# Patient Record
Sex: Male | Born: 1960 | Race: Black or African American | Hispanic: No | Smoking: Never smoker
Health system: Southern US, Community
[De-identification: ages and names within clinical notes are randomized; demographics above are authoritative.]

## PROBLEM LIST (undated history)

## (undated) DIAGNOSIS — J45909 Unspecified asthma, uncomplicated: Secondary | ICD-10-CM

## (undated) DIAGNOSIS — Z8679 Personal history of other diseases of the circulatory system: Secondary | ICD-10-CM

## (undated) DIAGNOSIS — IMO0001 Reserved for inherently not codable concepts without codable children: Secondary | ICD-10-CM

## (undated) DIAGNOSIS — K219 Gastro-esophageal reflux disease without esophagitis: Secondary | ICD-10-CM

## (undated) DIAGNOSIS — J939 Pneumothorax, unspecified: Secondary | ICD-10-CM

## (undated) HISTORY — PX: KNEE SURGERY: SHX244

## (undated) HISTORY — PX: BACK SURGERY: SHX140

## (undated) HISTORY — PX: NASAL SINUS SURGERY: SHX719

---

## 2000-03-20 ENCOUNTER — Emergency Department (HOSPITAL_COMMUNITY): Admission: EM | Admit: 2000-03-20 | Discharge: 2000-03-21 | Payer: Self-pay | Admitting: Emergency Medicine

## 2014-03-16 ENCOUNTER — Emergency Department
Admission: EM | Admit: 2014-03-16 | Discharge: 2014-03-16 | Disposition: A | Discharge status: Home / Self Care | Payer: PRIVATE HEALTH INSURANCE | Source: Home / Self Care | Attending: Emergency Medicine | Admitting: Emergency Medicine

## 2014-03-16 ENCOUNTER — Encounter: Payer: Self-pay | Admitting: Emergency Medicine

## 2014-03-16 DIAGNOSIS — R079 Chest pain, unspecified: Secondary | ICD-10-CM

## 2014-03-16 HISTORY — DX: Gastro-esophageal reflux disease without esophagitis: K21.9

## 2014-03-16 HISTORY — DX: Personal history of other diseases of the circulatory system: Z86.79

## 2014-03-16 HISTORY — DX: Reserved for inherently not codable concepts without codable children: IMO0001

## 2014-03-16 MED ORDER — GI COCKTAIL ~~LOC~~
30.0000 mL | Freq: Once | ORAL | Status: AC
  Administered 2014-03-16: 30 mL via ORAL

## 2014-03-16 NOTE — ED Notes (Signed)
Jonathan Barajas c/o CP that started last night in center of chest. After taking Maalox and repositioning the pain resolved. Pain has been central chest and intermittent today. C/o "just  Not feeling well today". 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 Len[facility] 1 month ago, no changes in aneurysm. Denies SOB, diaphoresis, jaw or back pain. Dr. Orson AloeHenderson notifed of patient hx and condition. EKG done.

## 2014-03-16 NOTE — ED Provider Notes (Signed)
CSN: [id number]     Arrival date & time 03/16/14  1616 History   First MD Initiated Contact with Patient 03/16/14 1642     Chief Complaint  Patient presents with  . Chest Pain   (Consider location/radiation/quality/duration/timing/severity/associated sxs/prior Treatment) HPI 53 year old American male presents chest pain since last night.  It is central and did not radiate, no shortness of breath, no diaphoresis, no feeling of impending doom.  He does have a history of gastric reflux and takes some Maalox last night which did help.  However he also sees a cardiologist yearly for an aneurysm (possibly a AAA) that he has yearly MRIs for and he was told about a month ago that is unchanged from last year.  His father still alive.  He is nonsmoker.  He does not have any history of hypertension or cholesterol issues and takes no medicines.  Painless that lasted for about 15 minutes and then went away.  He's had some minimal discomfort today but nothing severe.  He currently has no symptoms whatsoever.  His wife encouraged him to be checked out.  Past Medical History  Diagnosis Date  . Reflux   . History of cardiac aneurysm    Past Surgical History  Procedure Laterality Date  . Nasal sinus surgery    . Knee surgery     History reviewed. No pertinent family history. History  Substance Use Topics  . Smoking status: Never Smoker   . Smokeless tobacco: Never Used  . Alcohol Use: Yes    Review of Systems  All other systems reviewed and are negative.   Allergies  Review of patient's allergies indicates no known allergies.  Home Medications   Prior to Admission medications   Not on File   BP 130/82  Pulse 80  Resp 16  SpO2 97% Physical Exam  Nursing note and vitals reviewed. Constitutional: He is oriented to person, place, and time. Vital signs are normal. He appears well-developed and well-nourished.  Non-toxic appearance. He does not appear ill. No distress.  HENT:  Head:  Normocephalic and atraumatic.  Eyes: No scleral icterus.  Neck: Neck supple.  Cardiovascular: Normal rate, regular rhythm, normal heart sounds and intact distal pulses.   No extrasystoles are present.  Pulmonary/Chest: Effort normal and breath sounds normal. No respiratory distress. He has no decreased breath sounds. He has no wheezes. He has no rhonchi.  Abdominal: Normal appearance. There is no tenderness.  Neurological: He is alert and oriented to person, place, and time. GCS eye subscore is 4. GCS verbal subscore is 5. GCS motor subscore is 6.  Skin: Skin is warm and dry. He is not diaphoretic.  Psychiatric: He has a normal mood and affect. His speech is normal and behavior is normal. Thought content normal. His mood appears not anxious.    ED Course  Procedures (including critical care time) Labs Review Labs Reviewed - No data to display  Imaging Review No results found.   MDM   1. Chest pain    EKG done which shows nonspecific T-wave changes but no definite ST elevation or depression.  Patient initially given a GI cocktail however he has no pain now so would not make any difference.  I educated the patient on the options.  I feel the best for him to go to the emergency room for further evaluation and blood work.  I feel that he is healthy enough to drive himself to 2 miles to a local emergency room.  Patient declines this  and is going home instead.  I said if he does not go to the emergency room then I would like to at least call his cardiologist in the morning and gave him strict ER precautions for any returning of his symptoms.  However I let him know that  here in our urgent care I cannot do AMI  panels nor confirm the fact that this pain is from his heart or if it's coming from his reflux.  Therefore I let patient know that I wanted him to be checked out to ensure it's not his heart.  Patient again states that he does not want to go the emergency room and likely will call his  cardiologist tomorrow morning  No prescriptions given since he has reflux medicines at home.  His current physical examination is completely normal.    Marlaine HindJeffrey H Henderson, MD 03/16/14 1650

## 2014-03-18 ENCOUNTER — Telehealth: Payer: Self-pay | Admitting: *Deleted

## 2020-02-07 ENCOUNTER — Encounter: Payer: Self-pay | Admitting: Emergency Medicine

## 2020-02-07 ENCOUNTER — Emergency Department
Admission: EM | Admit: 2020-02-07 | Discharge: 2020-02-07 | Disposition: A | Discharge status: Home / Self Care | Payer: PRIVATE HEALTH INSURANCE | Source: Home / Self Care | Attending: Family Medicine | Admitting: Family Medicine

## 2020-02-07 ENCOUNTER — Other Ambulatory Visit: Payer: Self-pay

## 2020-02-07 ENCOUNTER — Telehealth: Payer: Self-pay | Admitting: Emergency Medicine

## 2020-02-07 ENCOUNTER — Ambulatory Visit (HOSPITAL_BASED_OUTPATIENT_CLINIC_OR_DEPARTMENT_OTHER)
Admission: RE | Admit: 2020-02-07 | Discharge: 2020-02-07 | Disposition: A | Discharge status: Home / Self Care | Payer: 59 | Source: Ambulatory Visit | Attending: Family Medicine | Admitting: Family Medicine

## 2020-02-07 DIAGNOSIS — M79662 Pain in left lower leg: Secondary | ICD-10-CM | POA: Insufficient documentation

## 2020-02-07 DIAGNOSIS — Z8616 Personal history of COVID-19: Secondary | ICD-10-CM

## 2020-02-07 LAB — COMPLETE METABOLIC PANEL WITH GFR
AG Ratio: 1.7 (calc) (ref 1.0–2.5)
ALT: 35 U/L (ref 9–46)
AST: 29 U/L (ref 10–35)
Albumin: 4.4 g/dL (ref 3.6–5.1)
Alkaline phosphatase (APISO): 68 U/L (ref 35–144)
BUN: 14 mg/dL (ref 7–25)
CO2: 24 mmol/L (ref 20–32)
Calcium: 9.5 mg/dL (ref 8.6–10.3)
Chloride: 108 mmol/L (ref 98–110)
Creat: 1.13 mg/dL (ref 0.70–1.33)
GFR, Est African American: 83 mL/min/{1.73_m2} (ref >=60)
GFR, Est Non African American: 71 mL/min/{1.73_m2} (ref >=60)
Globulin: 2.6 g/dL (calc) (ref 1.9–3.7)
Glucose, Bld: 101 mg/dL — ABNORMAL HIGH (ref 65–99)
Potassium: 4.3 mmol/L (ref 3.5–5.3)
Sodium: 140 mmol/L (ref 135–146)
Total Bilirubin: 0.8 mg/dL (ref 0.2–1.2)
Total Protein: 7 g/dL (ref 6.1–8.1)

## 2020-02-07 LAB — CK: Total CK: 268 U/L — ABNORMAL HIGH (ref 44–196)

## 2020-02-07 NOTE — Discharge Instructions (Addendum)
If symptoms become significantly worse during the night or over the weekend, proceed to the local emergency room.  

## 2020-02-07 NOTE — Telephone Encounter (Signed)
Writer spoke w/ pt - confirmed name & DOB. Results of DVT study on left leg were negative. Results called to pt per request by Dr Cathren Harsh. Labs pending, expect results on 02/08/20. Pt verbalized an understanding that he would only be notified if results were abnormal. My Chart set up instructions were given at discharge earlier today.

## 2020-02-07 NOTE — ED Triage Notes (Signed)
2nd Pfizer vaccine on 3/30, pt c/o of left leg calf cramps since then - only at night  No relief w/ tylenol Increased PO intake (gatorade) and cramps resolved on right leg, left is more of a painful pressure

## 2020-02-09 NOTE — ED Provider Notes (Signed)
[facility]    CSN: [id number] Arrival date & time: 02/07/20  1132      History   Chief Complaint Chief Complaint  Patient presents with   Leg Pain    left calf     HPI Jonathan Barajas is a 59 y.o. male.    Patient reports that he had second Pfizer COVID19 vaccination on 01/26/20.  Afterwards he developed bilateral leg cramps, primarily at night.  He increased his PO fluid intake (Gatorade) and right leg cramps resolved.  He describes his discomfort as a pressure-like ache, concentrated on his left lateral calf.  He denies swelling, warmth, or claudication.  He denies recent increase in physical activities.  He denies chest tightness or shortness of breath He denies history of DVT.  He was hospitalized for COVID19 in September 2020.     2nd Pfizer vaccine on 3/30, pt c/o of left leg calf cramps since then - only at night  No relief w/ tylenol Increased PO intake (gatorade) and cramps resolved on right leg, left is more of a painful pressure  The history is provided by the patient.  Leg Pain Location:  Leg Time since incident:  12 days Injury: no   Leg location:  L leg Pain details:    Quality:  Aching   Radiates to:  Does not radiate   Severity:  Mild   Onset quality:  Gradual   Duration:  11 days   Timing:  Constant   Progression:  Unchanged Chronicity:  New Prior injury to area:  No Exacerbated by: nighttime. Ineffective treatments:  None tried Associated symptoms: no decreased ROM, no fatigue, no fever, no muscle weakness, no swelling and no tingling     Past Medical History:  Diagnosis Date   History of cardiac aneurysm    Reflux     There are no problems to display for this patient.   Past Surgical History:  Procedure Laterality Date   KNEE SURGERY     NASAL SINUS SURGERY         Home Medications    Prior to Admission medications   Not on File    Family History Family History  Problem Relation Age of Onset   Diabetes  Father    Healthy Sister    Healthy Brother    Healthy Sister    Healthy Brother    Healthy Brother     Social History Social History   Tobacco Use   Smoking status: Never Smoker   Smokeless tobacco: Never Used  Substance Use Topics   Alcohol use: Yes    Alcohol/week: 3.0 standard drinks    Types: 3 Standard drinks or equivalent per week   Drug use: No     Allergies   Patient has no known allergies.   Review of Systems Review of Systems  Constitutional: Negative for activity change, chills, diaphoresis, fatigue and fever.  HENT: Negative.   Eyes: Negative.   Respiratory: Negative for cough, chest tightness, shortness of breath, wheezing and stridor.   Cardiovascular: Negative for chest pain and leg swelling.  Gastrointestinal: Negative.   Genitourinary: Negative.   Musculoskeletal:       Left leg pain  Skin: Negative for color change.  Neurological: Negative.      Physical Exam Triage Vital Signs ED Triage Vitals  Enc Vitals Group     BP 02/07/20 1147 (!) 156/79     Pulse Rate 02/07/20 1147 86     Resp 02/07/20 1147 15  Temp 02/07/20 1147 99.2 F (37.3 C)     Temp Source 02/07/20 1147 Oral     SpO2 02/07/20 1147 97 %     Weight 02/07/20 1148 233 lb (105.7 kg)     Height 02/07/20 1148 6\' 2"  (1.88 m)     Head Circumference --      Peak Flow --      Pain Score 02/07/20 1148 2     Pain Loc --      Pain Edu? --      Excl. in [place]? --    No data found.  Updated Vital Signs BP (!) 156/79 (BP Location: Right Arm)    Pulse 86    Temp 99.2 F (37.3 C) (Oral)    Resp 15    Ht 6\' 2"  (1.88 m)    Wt 105.7 kg    SpO2 97%    BMI 29.92 kg/m   Visual Acuity Right Eye Distance:   Left Eye Distance:   Bilateral Distance:    Right Eye Near:   Left Eye Near:    Bilateral Near:     Physical Exam Constitutional:      General: He is not in acute distress. HENT:     Head: Normocephalic.     Mouth/Throat:     Pharynx: Oropharynx is clear.  Eyes:      Pupils: Pupils are equal, round, and reactive to light.  Cardiovascular:     Heart sounds: Normal heart sounds.  Pulmonary:     Breath sounds: Normal breath sounds.  Abdominal:     Tenderness: There is no abdominal tenderness.  Musculoskeletal:        General: No swelling.     Cervical back: Neck supple.     Right lower leg: No edema.     Left lower leg: No edema.       Legs:     Comments: Left lower leg has vague tenderness to palpation lateral calf as noted on diagram.  No swelling or warmth.  Distal pulses intact.  Negative Homan's test.  Lymphadenopathy:     Cervical: No cervical adenopathy.  Skin:    General: Skin is warm and dry.     Findings: No erythema or rash.  Neurological:     General: No focal deficit present.     Mental Status: He is alert.      UC Treatments / Results  Labs (all labs ordered are listed, but only abnormal results are displayed) Labs Reviewed  CK -       Result Value          COMPLETE METABOLIC PANEL WITH GFR             EKG   Radiology 04/08/20 Venous Img Lower Unilateral Left (DVT)  Result Date: 02/07/2020 CLINICAL DATA:  Left lower extremity cramping after COVID vaccine. EXAM: LEFT LOWER EXTREMITY VENOUS DOPPLER ULTRASOUND TECHNIQUE: Gray-scale sonography with compression, as well as color and duplex ultrasound, were performed to evaluate the deep venous system(s) from the level of the common femoral vein through the popliteal and proximal calf veins. COMPARISON:  None. FINDINGS: VENOUS Normal compressibility of the common femoral, superficial femoral, and popliteal veins, as well as the visualized calf veins. Visualized portions of profunda femoral vein and great saphenous vein unremarkable. No filling defects to suggest DVT on grayscale or color Doppler imaging. Doppler waveforms show normal direction of venous flow, normal respiratory phasicity and response to augmentation. Limited views of the contralateral common  femoral vein are  unremarkable. OTHER None. Limitations: none IMPRESSION: No femoropopliteal DVT nor evidence of DVT within the visualized calf veins. If clinical symptoms are inconsistent or if there are persistent or worsening symptoms, further imaging (possibly involving the iliac veins) may be warranted. Electronically Signed   By: Marin Olp M.D.   On: 02/07/2020 14:42    Procedures Procedures (including critical care time)  Medications Ordered in UC Medications - No data to display  Initial Impression / Assessment and Plan / UC Course  I have reviewed the triage vital signs and the nursing notes.  Pertinent labs & imaging results that were available during my care of the patient were reviewed by me and considered in my medical decision making (see chart for details).    Negative ultrasound for DVT.  Symptoms suggestive of shin splints. Check CMP and CK.  If abnormal recommend followup with PCP   Final Clinical Impressions(s) / UC Diagnoses   Final diagnoses:  Pain of left calf  History of COVID-19     Discharge Instructions     If symptoms become significantly worse during the night or over the weekend, proceed to the local emergency room.    ED Prescriptions    None        Kandra Nicolas, MD 02/09/20 [phone]

## 2020-02-11 ENCOUNTER — Telehealth: Payer: Self-pay | Admitting: Emergency Medicine

## 2020-02-11 NOTE — Telephone Encounter (Signed)
CK levels slightly elevated, follow up with PCP

## 2020-04-04 ENCOUNTER — Emergency Department (INDEPENDENT_AMBULATORY_CARE_PROVIDER_SITE_OTHER): Payer: 59

## 2020-04-04 ENCOUNTER — Other Ambulatory Visit: Payer: Self-pay

## 2020-04-04 ENCOUNTER — Emergency Department (INDEPENDENT_AMBULATORY_CARE_PROVIDER_SITE_OTHER)
Admission: EM | Admit: 2020-04-04 | Discharge: 2020-04-04 | Disposition: A | Discharge status: Home / Self Care | Payer: 59 | Source: Home / Self Care

## 2020-04-04 DIAGNOSIS — R0600 Dyspnea, unspecified: Secondary | ICD-10-CM

## 2020-04-04 DIAGNOSIS — R0609 Other forms of dyspnea: Secondary | ICD-10-CM

## 2020-04-04 DIAGNOSIS — Z8709 Personal history of other diseases of the respiratory system: Secondary | ICD-10-CM | POA: Diagnosis not present

## 2020-04-04 DIAGNOSIS — R079 Chest pain, unspecified: Secondary | ICD-10-CM

## 2020-04-04 DIAGNOSIS — R0789 Other chest pain: Secondary | ICD-10-CM

## 2020-04-04 DIAGNOSIS — R9431 Abnormal electrocardiogram [ECG] [EKG]: Secondary | ICD-10-CM

## 2020-04-04 MED ORDER — GENERIC EXTERNAL MEDICATION
Status: DC
Start: ? — End: 2020-04-04

## 2020-04-04 MED ORDER — SODIUM CHLORIDE 0.9 % IV SOLN
10.00 | INTRAVENOUS | Status: DC
Start: ? — End: 2020-04-04

## 2020-04-04 MED ORDER — ASPIRIN 81 MG PO TBEC
324.0000 mg | DELAYED_RELEASE_TABLET | Freq: Once | ORAL | Status: AC
  Administered 2020-04-04: 324 mg via ORAL

## 2020-04-04 NOTE — Discharge Instructions (Signed)
  Because of your cardiac history and continued chest pain with otherwise normal chest x-ray, it is recommended you go to the hospital for further evaluation of your chest pain to make sure it is not your heart such as an early heart attack or your aneurysm.   You have declined EMS transport. Please drive directly to [facility] for further evaluation and treatment.

## 2020-04-04 NOTE — ED Notes (Signed)
Patient is being discharged from the Urgent Care and sent to the Emergency Department via POV . Per Waylan Rocher, PA-C, patient is in need of higher level of care due to chest pain w/ abnormal EKG and hx of cardiac aneursym. Patient is aware and verbalizes understanding of plan of care.  Vitals:   04/04/20 1255  BP: (!) 156/93  Pulse: 86  Resp: 16  Temp: 98.5 F (36.9 C)  SpO2: 98%

## 2020-04-04 NOTE — ED Triage Notes (Signed)
Patient presents to Urgent Care with complaints of left sided chest pain that radiates down his left arm since about three hours ago. Patient reports he has high cholesterol, has been taking meds as prescribed. Pt denies n/v, endorses slight shortness of breath w/ exertion. Pt has hx of pneumothorax when he had covid at the end of last year.

## 2020-04-04 NOTE — ED Provider Notes (Addendum)
[facility]    CSN: [id number] Arrival date & time: 04/04/20  1240      History   Chief Complaint Chief Complaint  Patient presents with  . Chest Pain    HPI Jonathan Barajas is a 59 y.o. male.   HPI Jonathan Barajas is a 59 y.o. male presenting to UC with c/o ongoing Left sided chest pain radiating into his Left arm that started 3 hours PTA. Pain is aching and sore, 7/10.  Associated DOE while at work so he decided to come be evaluated. He reports hx of a cardiac aneurysm his cardiologist has been monitoring, it has not changed in size since it was first dx.  Pt also reports hx of Covid at the end of September last year. 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  Denies recent illness. No cough, congestion, fever. No n/v/d. Last visit with his cardiologist was over 1 year ago.    Past Medical History:  Diagnosis Date  . History of cardiac aneurysm   . Reflux     There are no problems to display for this patient.   Past Surgical History:  Procedure Laterality Date  . KNEE SURGERY    . NASAL SINUS SURGERY         Home Medications    Prior to Admission medications   Medication Sig Start Date End Date Taking? Authorizing Provider  albuterol (VENTOLIN HFA) 108 (90 Base) MCG/ACT inhaler Inhale into the lungs every 6 (six) hours as needed for wheezing or shortness of breath.   Yes [provider]    Family History Family History  Problem Relation Age of Onset  . Diabetes Father   . Healthy Sister   . Healthy Brother   . Healthy Sister   . Healthy Brother   . Healthy Brother     Social History Social History   Tobacco Use  . Smoking status: Never Smoker  . Smokeless tobacco: Never Used  Substance Use Topics  . Alcohol use: Yes    Alcohol/week: 3.0 standard drinks    Types: 3 Standard drinks or equivalent per week  . Drug use: No     Allergies   Patient has no known allergies.   Review of Systems Review of Systems  Constitutional:  Negative for chills, diaphoresis and fever.  HENT: Negative for congestion.   Respiratory: Positive for chest tightness and shortness of breath.   Cardiovascular: Positive for chest pain.  Gastrointestinal: Negative for nausea.  Neurological: Negative for dizziness, light-headedness and headaches.     Physical Exam Triage Vital Signs ED Triage Vitals  Enc Vitals Group     BP      Pulse      Resp      Temp      Temp src      SpO2      Weight      Height      Head Circumference      Peak Flow      Pain Score      Pain Loc      Pain Edu?      Excl. in [place]?    No data found.  Updated Vital Signs BP (!) 156/93 (BP Location: Right Arm)   Pulse 86   Temp 98.5 F (36.9 C) (Oral)   Resp 16   SpO2 98%   Visual Acuity Right Eye Distance:   Left Eye Distance:   Bilateral Distance:    Right Eye  Near:   Left Eye Near:    Bilateral Near:     Physical Exam Vitals and nursing note reviewed.  Constitutional:      General: He is not in acute distress.    Appearance: He is well-developed. He is not ill-appearing, toxic-appearing or diaphoretic.  HENT:     Head: Normocephalic and atraumatic.  Cardiovascular:     Rate and Rhythm: Normal rate and regular rhythm.  Pulmonary:     Effort: Pulmonary effort is normal.     Breath sounds: No decreased breath sounds, wheezing, rhonchi or rales.  Chest:     Chest wall: No tenderness.  Musculoskeletal:        General: Normal range of motion.     Cervical back: Normal range of motion.  Skin:    General: Skin is warm and dry.  Neurological:     Mental Status: He is alert and oriented to person, place, and time.  Psychiatric:        Behavior: Behavior normal.      UC Treatments / Results  Labs (all labs ordered are listed, but only abnormal results are displayed) Labs Reviewed - No data to display  EKG Date/Time:04/04/2020   12:48:46 Ventricular Rate: 88 PR Interval: 176 QRS Duration: 88 QT Interval: 354 QTC  Calculation: 428 P-R-T axes:   57   57   31 Text Interpretation: Normal sinus rhythm. Possible Left atrial enlargement. Cannot rule out anterior infarct, age undetermined. T wave abnormality, consider inferolateral ischemia. Abnormal ECG  Only text interpretation available through Care Everywhere. Mention of T wave abnormality on EKG from 08/28/2019 but no mention of possible anterior infarct (pt had negative troponin at that time).     Radiology DG Chest 2 View  Result Date: 04/04/2020 CLINICAL DATA:  Left-sided chest pain.  History of pneumothorax. EXAM: CHEST - 2 VIEW COMPARISON:  08/27/2019 plain film and CT FINDINGS: Midline trachea. Normal heart size. Tortuous thoracic aorta. No pleural effusion or pneumothorax. Improved left lower lobe aeration with mild scarring remaining. No new pulmonary opacity. IMPRESSION: Improved left lower lobe aeration with mild scarring remaining. No acute findings or evidence of pneumothorax. Electronically Signed   By: Jeronimo Greaves M.D.   On: 04/04/2020 13:30    Procedures Procedures (including critical care time)  Medications Ordered in UC Medications  aspirin EC tablet 324 mg (324 mg Oral Given 04/04/20 1304)    Initial Impression / Assessment and Plan / UC Course  I have reviewed the triage vital signs and the nursing notes.  Pertinent labs & imaging results that were available during my care of the patient were reviewed by me and considered in my medical decision making (see chart for details).     Atypical chest pain. Give pt's cardiopulmonary hx, recommend further evaluation in emergency department. Pt declined EMS transport but verbalized understanding and agreement with further evaluation at the hospital today. AVS provided  Final Clinical Impressions(s) / UC Diagnoses   Final diagnoses:  Left-sided chest pain  Abnormal EKG  DOE (dyspnea on exertion)     Discharge Instructions      Because of your cardiac history and continued  chest pain with otherwise normal chest x-ray, it is recommended you go to the hospital for further evaluation of your chest pain to make sure it is not your heart such as an early heart attack or your aneurysm.   You have declined EMS transport. Please drive directly to [facility] for further evaluation and treatment.  ED Prescriptions    None     PDMP not reviewed this encounter.   Lurene Shadow, PA-C 04/04/20 1405    Lurene Shadow, PA-C 04/04/20 1406

## 2020-11-03 IMAGING — DX DG CHEST 2V
2 series · 2 of 2 positions shown · non-contrast
Comparison: 08/27/2019 plain film and CT

CLINICAL DATA: Left-sided chest pain.  History of pneumothorax.

EXAM:
CHEST - 2 VIEW

[chest pa]
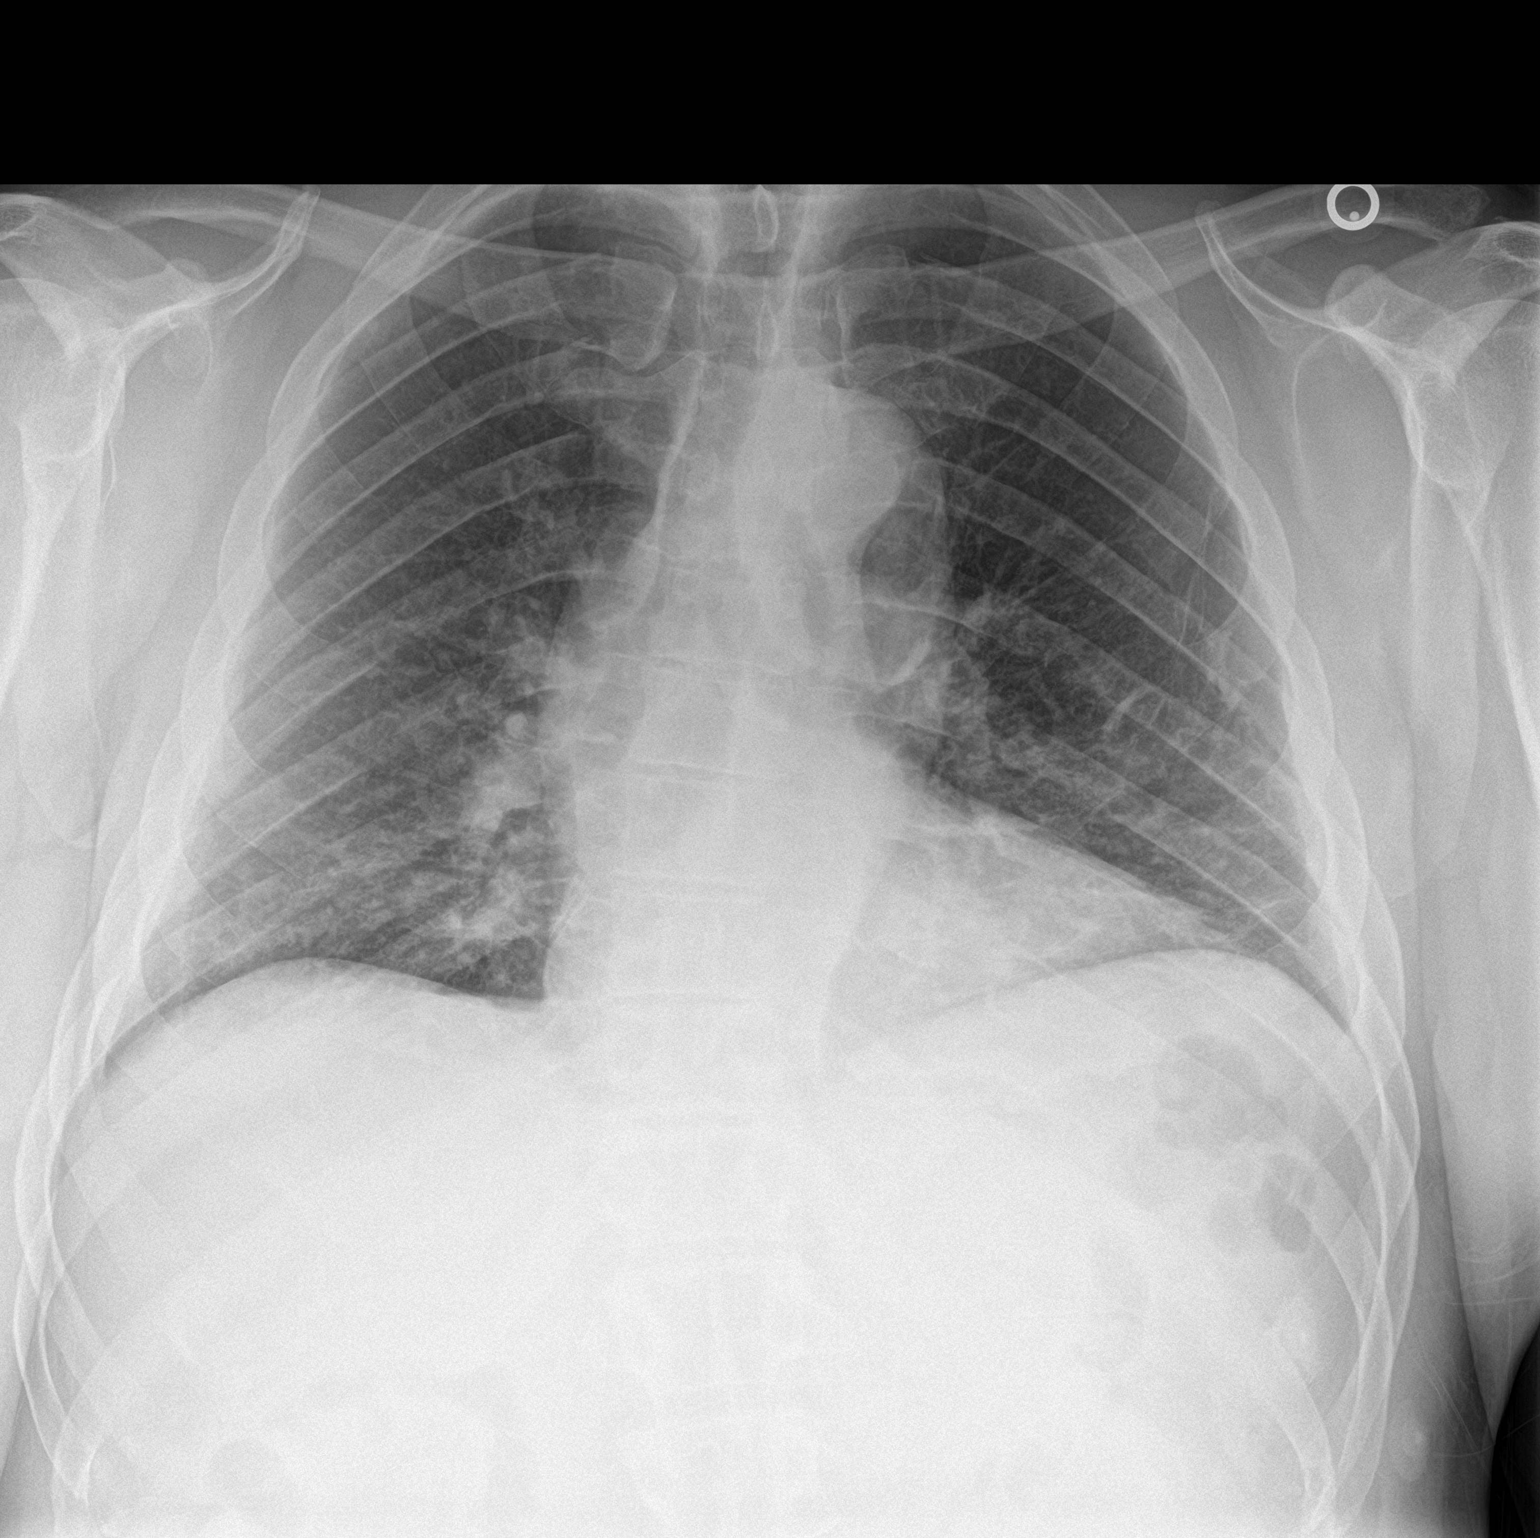

[chest lat]
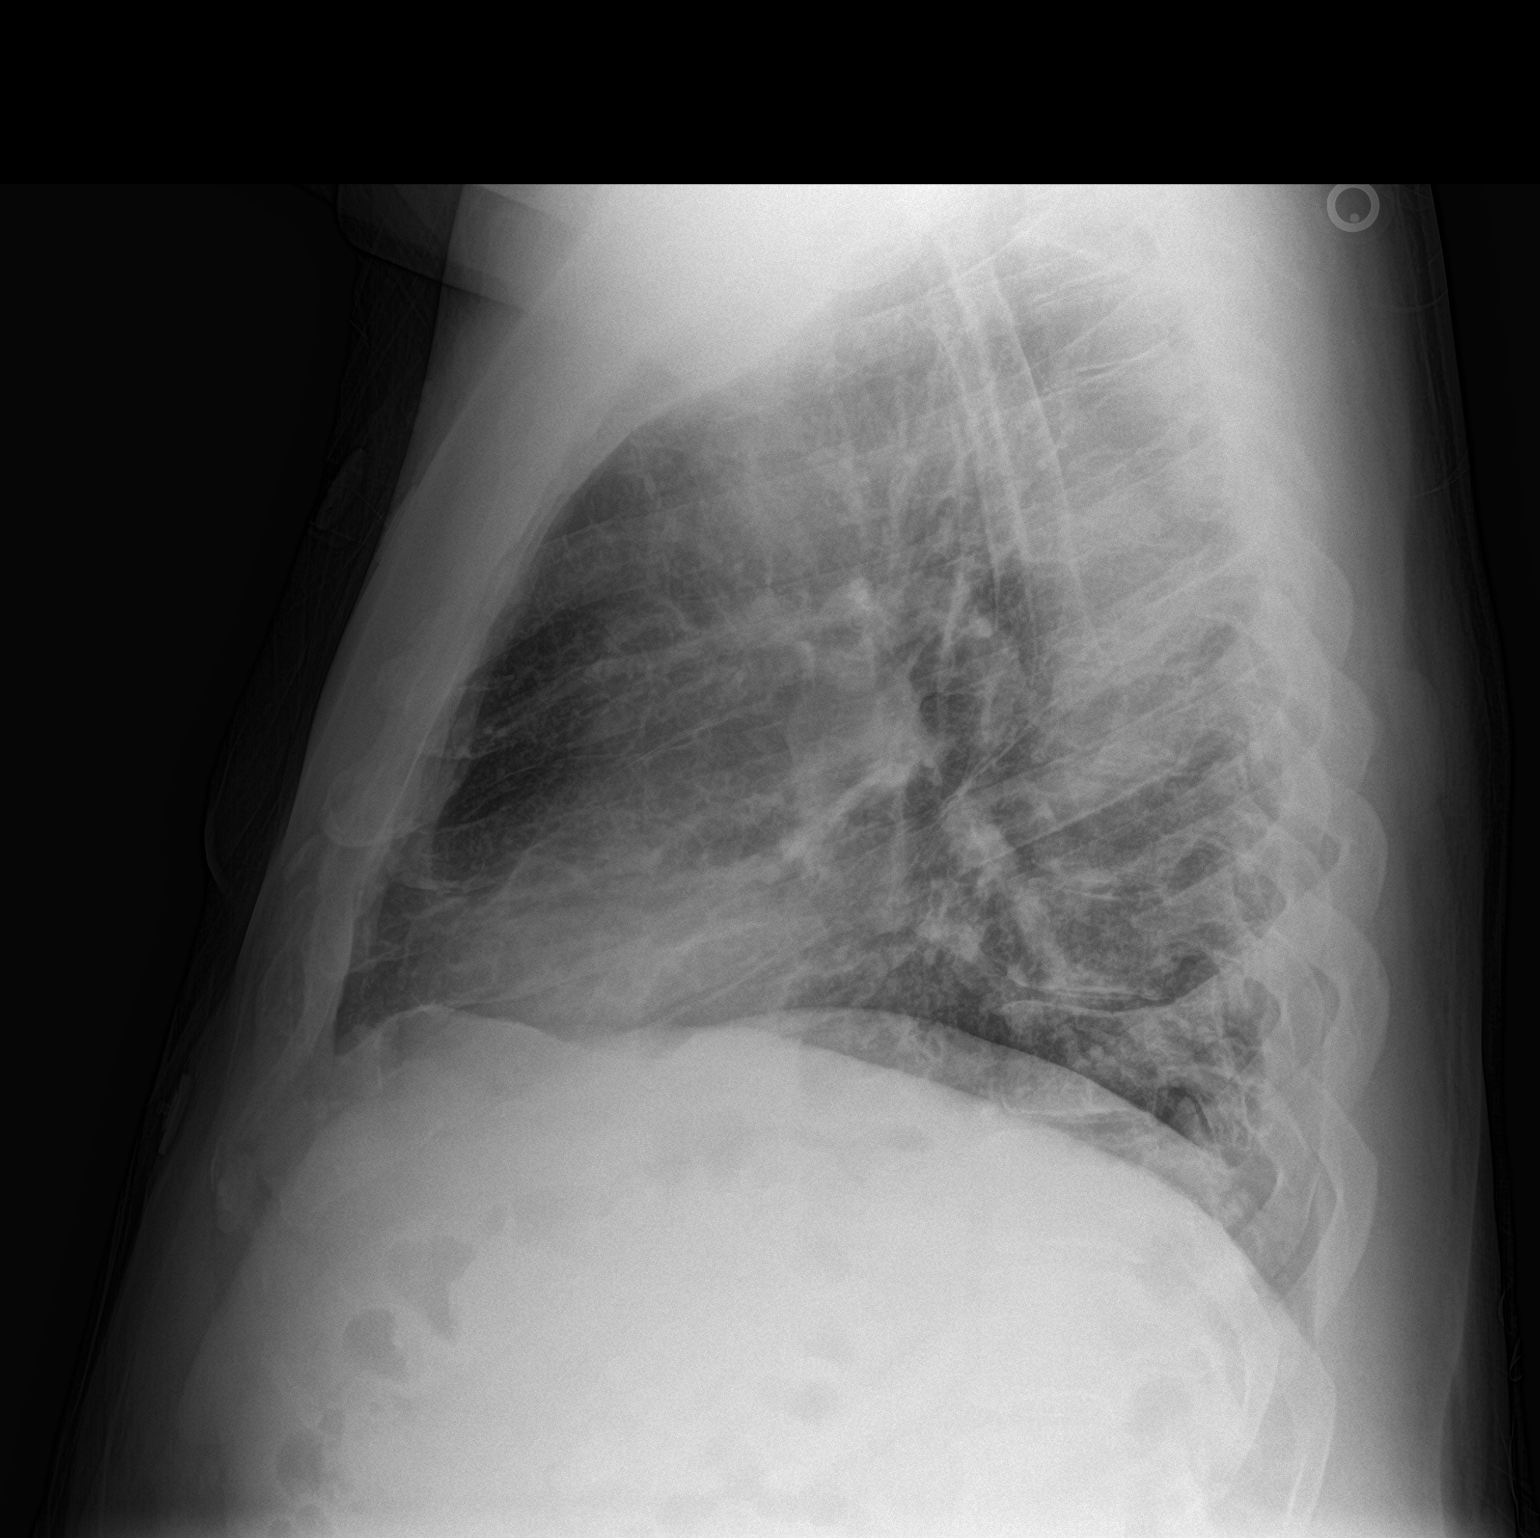

[2 of 2 positions shown; findings below may reference images not displayed]

FINDINGS: Midline trachea. Normal heart size. Tortuous thoracic aorta. No
pleural effusion or pneumothorax. Improved left lower lobe aeration
with mild scarring remaining. No new pulmonary opacity.
IMPRESSION: Improved left lower lobe aeration with mild scarring remaining. No
acute findings or evidence of pneumothorax.

## 2022-01-01 ENCOUNTER — Emergency Department (INDEPENDENT_AMBULATORY_CARE_PROVIDER_SITE_OTHER): Payer: 59

## 2022-01-01 ENCOUNTER — Emergency Department
Admission: EM | Admit: 2022-01-01 | Discharge: 2022-01-01 | Disposition: A | Discharge status: Home / Self Care | Payer: 59 | Source: Home / Self Care

## 2022-01-01 ENCOUNTER — Other Ambulatory Visit: Payer: Self-pay

## 2022-01-01 DIAGNOSIS — J0100 Acute maxillary sinusitis, unspecified: Secondary | ICD-10-CM

## 2022-01-01 DIAGNOSIS — R059 Cough, unspecified: Secondary | ICD-10-CM

## 2022-01-01 DIAGNOSIS — J309 Allergic rhinitis, unspecified: Secondary | ICD-10-CM

## 2022-01-01 HISTORY — DX: Unspecified asthma, uncomplicated: J45.909

## 2022-01-01 HISTORY — DX: Pneumothorax, unspecified: J93.9

## 2022-01-01 MED ORDER — BENZONATATE 200 MG PO CAPS: 200.0000 mg | ORAL_CAPSULE | Freq: Three times a day (TID) | ORAL | 0 refills | Status: AC | PRN

## 2022-01-01 MED ORDER — PROMETHAZINE-DM 6.25-15 MG/5ML PO SYRP: 5.0000 mL | ORAL_SOLUTION | Freq: Two times a day (BID) | ORAL | 0 refills | Status: DC | PRN

## 2022-01-01 MED ORDER — AMOXICILLIN-POT CLAVULANATE 875-125 MG PO TABS: 1.0000 | ORAL_TABLET | Freq: Two times a day (BID) | ORAL | 0 refills | Status: AC

## 2022-01-01 MED ORDER — FEXOFENADINE HCL 180 MG PO TABS: 180.0000 mg | ORAL_TABLET | Freq: Every day | ORAL | 0 refills | Status: DC

## 2022-01-01 MED ORDER — PREDNISONE 20 MG PO TABS: ORAL_TABLET | ORAL | 0 refills | Status: DC

## 2022-01-01 NOTE — ED Triage Notes (Signed)
Pt presents to Urgent Care with c/o productive cough (green sputum) and nasal congestion x 5 days, also intermittent sob. Reports 2 negative home COVID tests.  ?

## 2022-01-01 NOTE — Discharge Instructions (Addendum)
Informed patient of chest x-ray results today.  Instructed patient to discontinue OTC NyQuil/DayQuil.  Advised patient to take medication as directed with food to completion.  Advised patient to take prednisone and Allegra with first dose of Augmentin for the next 5 of 10 days.  Advised patient may use Allegra as needed afterwards for concurrent postnasal drainage/drip.  Advised patient may use Promethazine DM in early evening or prior to sleep for cough.  Patient has been advised of sedate of effects of this medication.  Advised patient may use Tessalon Perles daily or as needed for cough.  Encouraged patient not to use Promethazine DM and Tessalon Perles together.  Encouraged patient to increase daily water intake while taking these medications. ?

## 2022-01-01 NOTE — ED Provider Notes (Signed)
[facility]    CSN: [id number] Arrival date & time: 01/01/22  1141      History   Chief Complaint Chief Complaint  Patient presents with   Cough   Nasal Congestion    HPI Jonathan Barajas is a 61 y.o. male.   HPI 61 year old male presents with productive cough with green sputum and nasal congestion for 5 days.  Additionally reports intermittent shortness of breath and 2 negative home COVID-19 test.  PMH is significant for history of cardiac aneurysm and asthma.  Patient concerns asthma medications as Advair discus, Spiriva, and albuterol currently.  Past Medical History:  Diagnosis Date   Asthma    History of cardiac aneurysm    Pneumothorax    w/ COVID   Reflux     There are no problems to display for this patient.   Past Surgical History:  Procedure Laterality Date   KNEE SURGERY     NASAL SINUS SURGERY         Home Medications    Prior to Admission medications   Medication Sig Start Date End Date Taking? Authorizing Provider  amoxicillin-clavulanate (AUGMENTIN) 875-125 MG tablet Take 1 tablet by mouth 2 (two) times daily for 10 days. 01/01/22 01/11/22 Yes Trevor Iha, FNP  benzonatate (TESSALON) 200 MG capsule Take 1 capsule (200 mg total) by mouth 3 (three) times daily as needed for up to 7 days for cough. 01/01/22 01/08/22 Yes Trevor Iha, FNP  dupilumab (DUPIXENT) 200 MG/1. prefilled syringe INJECT 200MG  SUBCUTANEOUSLY EVERY OTHER WEEK 09/06/21  Yes [provider]  fexofenadine (ALLEGRA ALLERGY) 180 MG tablet Take 1 tablet (180 mg total) by mouth daily for 15 days. 01/01/22 01/16/22 Yes 01/18/22, FNP  fluticasone-salmeterol (ADVAIR DISKUS) 250-50 MCG/ACT AEPB Inhale into the lungs. 02/22/20  Yes [provider]  predniSONE (DELTASONE) 20 MG tablet Take 3 tabs PO daily x 5 days. 01/01/22  Yes 03/03/22, FNP  promethazine-dextromethorphan (PROMETHAZINE-DM) 6.25-15 MG/5ML syrup Take 5 mLs by mouth 2 (two) times daily as needed  for cough. 01/01/22  Yes 03/03/22, FNP  Pseudoeph-Doxylamine-DM-APAP (DAYQUIL/NYQUIL COLD/FLU RELIEF PO) Take by mouth.   Yes [provider]  albuterol (VENTOLIN HFA) 108 (90 Base) MCG/ACT inhaler Inhale into the lungs every 6 (six) hours as needed for wheezing or shortness of breath.    [provider]  tiotropium (SPIRIVA HANDIHALER) 18 MCG inhalation capsule Place into inhaler and inhale.    [provider]    Family History Family History  Problem Relation Age of Onset   Diabetes Father    Healthy Sister    Healthy Sister    Healthy Brother    Healthy Brother    Healthy Brother     Social History Social History   Tobacco Use   Smoking status: Never   Smokeless tobacco: Never  Vaping Use   Vaping Use: Never used  Substance Use Topics   Alcohol use: Not Currently    Comment: occasionally   Drug use: No     Allergies   Patient has no known allergies.   Review of Systems Review of Systems  HENT:  Positive for congestion.   Respiratory:  Positive for choking and shortness of breath.   All other systems reviewed and are negative.   Physical Exam Triage Vital Signs ED Triage Vitals  Enc Vitals Group     BP 01/01/22 1310 (!) 145/80     Pulse Rate 01/01/22 1310 95     Resp 01/01/22  1310 (!) 24     Temp 01/01/22 1310 100.1 F (37.8 C)     Temp Source 01/01/22 1310 Oral     SpO2 01/01/22 1310 94 %     Weight 01/01/22 1306 230 lb (104.3 kg)     Height 01/01/22 1306 6\' 2"  (1.88 m)     Head Circumference --      Peak Flow --      Pain Score 01/01/22 1304 0     Pain Loc --      Pain Edu? --      Excl. in GC? --    No data found.  Updated Vital Signs BP (!) 145/80 (BP Location: Right Arm)    Pulse 95    Temp 100.1 F (37.8 C) (Oral)    Resp (!) 24    Ht 6\' 2"  (1.88 m)    Wt 230 lb (104.3 kg)    SpO2 94%    BMI 29.53 kg/m    Physical Exam Vitals and nursing note reviewed.  Constitutional:      General: He is not in acute  distress.    Appearance: Normal appearance. He is obese. He is not ill-appearing.  HENT:     Head: Normocephalic and atraumatic.     Right Ear: Tympanic membrane and external ear normal.     Left Ear: Tympanic membrane and external ear normal.     Ears:     Comments: Moderate eustachian tube dysfunction noted bilaterally    Nose:     Comments: Turbinates are erythematous/edematous    Mouth/Throat:     Mouth: Mucous membranes are moist.     Pharynx: Oropharynx is clear.     Comments: Moderate amount of clear drainage of posterior oropharynx noted Eyes:     Extraocular Movements: Extraocular movements intact.     Conjunctiva/sclera: Conjunctivae normal.     Pupils: Pupils are equal, round, and reactive to light.  Cardiovascular:     Rate and Rhythm: Normal rate and regular rhythm.     Pulses: Normal pulses.     Heart sounds: Normal heart sounds.  Pulmonary:     Effort: Pulmonary effort is normal.     Breath sounds: Normal breath sounds.     Comments: Frequent nonproductive cough noted on exam Musculoskeletal:     Cervical back: Normal range of motion and neck supple. No tenderness.  Lymphadenopathy:     Cervical: No cervical adenopathy.  Skin:    General: Skin is warm and dry.  Neurological:     General: No focal deficit present.     Mental Status: He is alert and oriented to person, place, and time.     UC Treatments / Results  Labs (all labs ordered are listed, but only abnormal results are displayed) Labs Reviewed - No data to display  EKG   Radiology DG Chest 2 View  Result Date: 01/01/2022 CLINICAL DATA:  Productive cough. EXAM: CHEST - 2 VIEW COMPARISON:  April 04, 2020. FINDINGS: The heart size and mediastinal contours are within normal limits. Stable bibasilar subsegmental atelectasis or scarring is noted. The visualized skeletal structures are unremarkable. IMPRESSION: Stable bibasilar subsegmental atelectasis or scarring. Electronically Signed   By: 03/03/2022 M.D.   On: 01/01/2022 13:31    Procedures Procedures (including critical care time)  Medications Ordered in UC Medications - No data to display  Initial Impression / Assessment and Plan / UC Course  I have reviewed the triage vital signs and  the nursing notes.  Pertinent labs & imaging results that were available during my care of the patient were reviewed by me and considered in my medical decision making (see chart for details).     MDM: 1.  Acute maxillary sinusitis, recurrence not specified-Rx'd Augmentin: 2.  Cough-CXR was negative for acute cardiopulmonary process, Rx'd prednisone, Tessalon Perles, and Promethazine DM; 3.  Allergic rhinitis-Rx'd Allegra. Informed patient of chest x-ray results today.  Instructed patient to discontinue OTC NyQuil/DayQuil.  Advised patient to take medication as directed with food to completion.  Advised patient to take prednisone and Allegra with first dose of Augmentin for the next 5 of 10 days.  Advised patient may use Allegra as needed afterwards for concurrent postnasal drainage/drip.  Advised patient may use Promethazine DM in early evening or prior to sleep for cough.  Patient has been advised of sedate of effects of this medication.  Advised patient may use Tessalon Perles daily or as needed for cough.  Encouraged patient not to use Promethazine DM and Tessalon Perles together.  Encouraged patient to increase daily water intake while taking these medications. Final Clinical Impressions(s) / UC Diagnoses   Final diagnoses:  Cough, unspecified type  Acute maxillary sinusitis, recurrence not specified  Allergic rhinitis, unspecified seasonality, unspecified trigger     Discharge Instructions      Informed patient of chest x-ray results today.  Instructed patient to discontinue OTC NyQuil/DayQuil.  Advised patient to take medication as directed with food to completion.  Advised patient to take prednisone and Allegra with first dose of Augmentin  for the next 5 of 10 days.  Advised patient may use Allegra as needed afterwards for concurrent postnasal drainage/drip.  Advised patient may use Promethazine DM in early evening or prior to sleep for cough.  Patient has been advised of sedate of effects of this medication.  Advised patient may use Tessalon Perles daily or as needed for cough.  Encouraged patient not to use Promethazine DM and Tessalon Perles together.  Encouraged patient to increase daily water intake while taking these medications.     ED Prescriptions     Medication Sig Dispense Auth. Provider   amoxicillin-clavulanate (AUGMENTIN) 875-125 MG tablet Take 1 tablet by mouth 2 (two) times daily for 10 days. 20 tablet Trevor Iha, FNP   predniSONE (DELTASONE) 20 MG tablet Take 3 tabs PO daily x 5 days. 15 tablet Trevor Iha, FNP   fexofenadine [facility] ALLERGY) 180 MG tablet Take 1 tablet (180 mg total) by mouth daily for 15 days. 15 tablet Trevor Iha, FNP   promethazine-dextromethorphan (PROMETHAZINE-DM) 6.25-15 MG/5ML syrup Take 5 mLs by mouth 2 (two) times daily as needed for cough. 118 mL Trevor Iha, FNP   benzonatate (TESSALON) 200 MG capsule Take 1 capsule (200 mg total) by mouth 3 (three) times daily as needed for up to 7 days for cough. 40 capsule Trevor Iha, FNP      PDMP not reviewed this encounter.   Trevor Iha, FNP 01/01/22 1421

## 2022-01-14 ENCOUNTER — Emergency Department
Admission: RE | Admit: 2022-01-14 | Discharge: 2022-01-14 | Disposition: A | Discharge status: Home / Self Care | Payer: 59 | Source: Ambulatory Visit | Attending: Family Medicine | Admitting: Family Medicine

## 2022-01-14 ENCOUNTER — Other Ambulatory Visit: Payer: Self-pay

## 2022-01-14 ENCOUNTER — Ambulatory Visit: Payer: Self-pay

## 2022-01-14 VITALS — BP 130/88 | HR 72 | Temp 99.2°F | Resp 16 | Ht 74.0 in | Wt 230.0 lb

## 2022-01-14 DIAGNOSIS — M791 Myalgia, unspecified site: Secondary | ICD-10-CM

## 2022-01-14 MED ORDER — CYCLOBENZAPRINE HCL 10 MG PO TABS
10.0000 mg | ORAL_TABLET | Freq: Two times a day (BID) | ORAL | 0 refills | Status: DC | PRN
Start: 2022-01-14 — End: 2023-03-18

## 2022-01-14 NOTE — ED Provider Notes (Signed)
?[place] ? ? ? ?CSN: [id number] ?Arrival date & time: 01/14/22  [id number] ? ? ?  ? ?History   ?Chief Complaint ?Chief Complaint  ?Patient presents with  ? Muscle Pain  ? ? ?HPI ?Jonathan Barajas is a 61 y.o. male.  ? ?HPI ? ?Patient states that he had a sinus infection.  He was treated with Augmentin and prednisone.  The sinus infection went away.  A couple days later he started having muscle cramps.  He states that he will have Ed upper extremities, lower extremities, even in his face and neck when he yawns.  He states that he does not drink a lot of water but he is improved with drinking some Gatorade.  The muscle cramping has not gone away.  He noticed some redness in his right calf that is mildly tender.  He worries this may be a DVT.  He is here for evaluation. ?Patient was given Flexeril for muscle pain in the past and this helped him ?Patient states one of the bigger problems is that he cannot sleep at night due to the muscle pain.  He is awakened frequently with cramps and has to reposition ?No other change in diet.  No change in medications.  No over-the-counter supplements. ?He had blood work 4 months ago in December 2022.  It was normal ? ?Past Medical History:  ?Diagnosis Date  ? Asthma   ? History of cardiac aneurysm   ? Pneumothorax   ? w/ COVID  ? Reflux   ? ? ?There are no problems to display for this patient. ? ? ?Past Surgical History:  ?Procedure Laterality Date  ? KNEE SURGERY    ? NASAL SINUS SURGERY    ? ? ? ? ? ?Home Medications   ? ?Prior to Admission medications   ?Medication Sig Start Date End Date Taking? Authorizing Provider  ?cyclobenzaprine (FLEXERIL) 10 MG tablet Take 1 tablet (10 mg total) by mouth 2 (two) times daily as needed for muscle spasms. 01/14/22  Yes Raylene Everts, MD  ?albuterol (VENTOLIN HFA) 108 (90 Base) MCG/ACT inhaler Inhale into the lungs every 6 (six) hours as needed for wheezing or shortness of breath.    [provider]  ?dupilumab (DUPIXENT) 200  MG/1.14ML prefilled syringe INJECT 200MG  SUBCUTANEOUSLY EVERY OTHER WEEK 09/06/21   [provider]  ?fluticasone-salmeterol (ADVAIR DISKUS) 250-50 MCG/ACT AEPB Inhale into the lungs. 02/22/20   [provider]  ?tiotropium (SPIRIVA HANDIHALER) 18 MCG inhalation capsule Place into inhaler and inhale.    [provider]  ? ? ?Family History ?Family History  ?Problem Relation Age of Onset  ? Diabetes Father   ? Healthy Sister   ? Healthy Sister   ? Healthy Brother   ? Healthy Brother   ? Healthy Brother   ? ? ?Social History ?Social History  ? ?Tobacco Use  ? Smoking status: Never  ? Smokeless tobacco: Never  ?Vaping Use  ? Vaping Use: Never used  ?Substance Use Topics  ? Alcohol use: Not Currently  ?  Comment: occasionally  ? Drug use: No  ? ? ? ?Allergies   ?Patient has no active allergies. ? ? ?Review of Systems ?Review of Systems ?See HPI ? ?Physical Exam ?Triage Vital Signs ?ED Triage Vitals  ?Enc Vitals Group  ?   BP 01/14/22 0846 130/88  ?   Pulse Rate 01/14/22 0846 72  ?   Resp 01/14/22 0846 16  ?   Temp 01/14/22 0846 99.2 ?F (  37.3 ?C)  ?   Temp Source 01/14/22 0846 Oral  ?   SpO2 01/14/22 0846 97 %  ?   Weight 01/14/22 0851 230 lb (104.3 kg)  ?   Height 01/14/22 0851 6\' 2"  (1.88 m)  ?   Head Circumference --   ?   Peak Flow --   ?   Pain Score 01/14/22 0850 4  ?   Pain Loc --   ?   Pain Edu? --   ?   Excl. in [place]? --   ? ?No data found. ? ?Updated Vital Signs ?BP 130/88 (BP Location: Right Arm)   Pulse 72   Temp 99.2 ?F (37.3 ?C) (Oral)   Resp 16   Ht 6\' 2"  (1.88 m)   Wt 104.3 kg   SpO2 97%   BMI 29.53 kg/m?  ? ?    ? ?Physical Exam ?Constitutional:   ?   General: He is not in acute distress. ?   Appearance: Normal appearance. He is well-developed and normal weight.  ?HENT:  ?   Head: Normocephalic and atraumatic.  ?   Right Ear: Tympanic membrane and ear canal normal.  ?   Left Ear: Tympanic membrane and ear canal normal.  ?   Nose: Nose normal.  ?   Mouth/Throat:  ?   Mouth:  Mucous membranes are moist.  ?   Pharynx: No posterior oropharyngeal erythema.  ?Eyes:  ?   Conjunctiva/sclera: Conjunctivae normal.  ?   Pupils: Pupils are equal, round, and reactive to light.  ?Cardiovascular:  ?   Rate and Rhythm: Normal rate and regular rhythm.  ?   Heart sounds: Normal heart sounds.  ?Pulmonary:  ?   Effort: Pulmonary effort is normal. No respiratory distress.  ?   Breath sounds: Normal breath sounds.  ?Abdominal:  ?   General: Abdomen is flat. There is no distension.  ?   Palpations: Abdomen is soft.  ?Musculoskeletal:     ?   General: No swelling, tenderness, deformity or signs of injury. Normal range of motion.  ?   Cervical back: Normal range of motion.  ?   Right lower leg: No edema.  ?   Left lower leg: No edema.  ?Lymphadenopathy:  ?   Cervical: No cervical adenopathy.  ?Skin: ?   General: Skin is warm and dry.  ?   Comments: On the right calf, medial portion, just above the medial malleolus there is a faint erythematous patch of skin.  It is not raised.  It is not indurated.  It is mildly tender.  It is not warm.  Negative Homans.  No calf tenderness over the deep structures.  ?Neurological:  ?   General: No focal deficit present.  ?   Mental Status: He is alert.  ?Psychiatric:     ?   Mood and Affect: Mood normal.     ?   Behavior: Behavior normal.  ? ? ? ?UC Treatments / Results  ?Labs ?(all labs ordered are listed, but only abnormal results are displayed) ?Labs Reviewed  ?CBC WITH DIFFERENTIAL/PLATELET  ?COMPLETE METABOLIC PANEL WITH GFR  ?MAGNESIUM  ?TSH  ?CK  ?SEDIMENTATION RATE  ? ? ?EKG ? ? ?Radiology ?No results found. ? ?Procedures ?Procedures (including critical care time) ? ?Medications Ordered in UC ?Medications - No data to display ? ?Initial Impression / Assessment and Plan / UC Course  ?I have reviewed the triage vital signs and the nursing notes. ? ?Pertinent labs & imaging  results that were available during my care of the patient were reviewed by me and considered in my  medical decision making (see chart for details). ? ?  ? ?I am unable to explain the redness of the skin.  There is no open wound.  It does not look like cellulitisThere is no tenderness of the calf or medical findings to suggest deep vein thrombosis.  I will get blood work to help establish why he is having muscular pain.  He is a advised to increase his hydration.  Take Flexeril at night.  Check MyChart for test results tomorrow.  Follow-up with PCP ?Final Clinical Impressions(s) / UC Diagnoses  ? ?Final diagnoses:  ?Pain in the muscles  ? ? ? ?Discharge Instructions   ? ?  ?Increase your fluid intake ?I have prescribed Flexeril to take at bedtime to help with sleep.  This is a muscle relaxer. ?Check your blood work on [employer].  We will call you if anything is abnormal ? ? ?ED Prescriptions   ? ? Medication Sig Dispense Auth. Provider  ? cyclobenzaprine (FLEXERIL) 10 MG tablet Take 1 tablet (10 mg total) by mouth 2 (two) times daily as needed for muscle spasms. 20 tablet Raylene Everts, MD  ? ?  ? ?PDMP not reviewed this encounter. ?  ?Raylene Everts, MD ?01/14/22 1004 ? ?

## 2022-01-14 NOTE — Discharge Instructions (Addendum)
Increase your fluid intake ?I have prescribed Flexeril to take at bedtime to help with sleep.  This is a muscle relaxer. ?Check your blood work on [employer].  We will call you if anything is abnormal ?

## 2022-01-14 NOTE — ED Triage Notes (Addendum)
Muscle pain & cramping to extremities since finishing lats round of antibiotics & prednisone  ?Drinking gatorade has helped - less cramps ?Red area to R calf  x 3 days - sore - no warmth per pt  ?

## 2022-01-16 LAB — COMPLETE METABOLIC PANEL WITH GFR
AG Ratio: 1.6 (calc) (ref 1.0–2.5)
ALT: 43 U/L (ref 9–46)
AST: 27 U/L (ref 10–35)
Albumin: 4.1 g/dL (ref 3.6–5.1)
Alkaline phosphatase (APISO): 56 U/L (ref 35–144)
BUN: 19 mg/dL (ref 7–25)
CO2: 24 mmol/L (ref 20–32)
Calcium: 8.8 mg/dL (ref 8.6–10.3)
Chloride: 107 mmol/L (ref 98–110)
Creat: 1.16 mg/dL (ref 0.70–1.35)
Globulin: 2.5 g/dL (calc) (ref 1.9–3.7)
Glucose, Bld: 90 mg/dL (ref 65–99)
Potassium: 4.4 mmol/L (ref 3.5–5.3)
Sodium: 138 mmol/L (ref 135–146)
Total Bilirubin: 0.8 mg/dL (ref 0.2–1.2)
Total Protein: 6.6 g/dL (ref 6.1–8.1)
eGFR: 72 mL/min/{1.73_m2} (ref >=60)

## 2022-01-16 LAB — CBC WITH DIFFERENTIAL/PLATELET
Absolute Monocytes: 990 cells/uL — ABNORMAL HIGH (ref 200–950)
Basophils Absolute: 39 cells/uL (ref 0–200)
Basophils Relative: 0.4 %
Eosinophils Absolute: 196 cells/uL (ref 15–500)
Eosinophils Relative: 2 %
HCT: 45.7 % (ref 38.5–50.0)
Hemoglobin: 15.5 g/dL (ref 13.2–17.1)
Lymphs Abs: 3263 cells/uL (ref 850–3900)
MCH: 30.6 pg (ref 27.0–33.0)
MCHC: 33.9 g/dL (ref 32.0–36.0)
MCV: 90.1 fL (ref 80.0–100.0)
MPV: 8.7 fL (ref 7.5–12.5)
Monocytes Relative: 10.1 %
Neutro Abs: 5312 cells/uL (ref 1500–7800)
Neutrophils Relative %: 54.2 %
Platelets: 309 10*3/uL (ref 140–400)
RBC: 5.07 10*6/uL (ref 4.20–5.80)
RDW: 13.8 % (ref 11.0–15.0)
Total Lymphocyte: 33.3 %
WBC: 9.8 10*3/uL (ref 3.8–10.8)

## 2022-01-16 LAB — TSH: TSH: 1.19 mIU/L (ref 0.40–4.50)

## 2022-01-16 LAB — SEDIMENTATION RATE: Sed Rate: 2 mm/h (ref 0–20)

## 2022-01-16 LAB — MAGNESIUM: Magnesium: 2.2 mg/dL (ref 1.5–2.5)

## 2022-01-16 LAB — CK: Total CK: 129 U/L (ref 44–196)

## 2022-06-05 ENCOUNTER — Encounter: Payer: Self-pay | Admitting: Emergency Medicine

## 2022-06-05 ENCOUNTER — Ambulatory Visit
Admission: EM | Admit: 2022-06-05 | Discharge: 2022-06-05 | Disposition: A | Discharge status: Home / Self Care | Payer: 59

## 2022-06-05 DIAGNOSIS — R0789 Other chest pain: Secondary | ICD-10-CM | POA: Diagnosis not present

## 2022-06-05 DIAGNOSIS — K219 Gastro-esophageal reflux disease without esophagitis: Secondary | ICD-10-CM

## 2022-06-05 MED ORDER — PANTOPRAZOLE SODIUM 40 MG PO TBEC: 40.0000 mg | DELAYED_RELEASE_TABLET | Freq: Every day | ORAL | 0 refills | Status: AC

## 2022-06-05 NOTE — Discharge Instructions (Addendum)
Advised patient to take medication as directed.  Advised patient to take medication on empty stomach with 8 ounces of water in the morning and no other medications including inhalers or food for 45 minutes in order that this medication (a proton pump inhibitor) can work effectively.  Advised patient if symptoms worsen and/or unresolved please follow-up with PCP, go to local ED or here for further evaluation.

## 2022-06-05 NOTE — ED Provider Notes (Signed)
[facility]    CSN: [id number] Arrival date & time: 06/05/22  1105      History   Chief Complaint Chief Complaint  Patient presents with   Chest Pain    HPI Jonathan Barajas is a 61 y.o. male.   HPI Very pleasant 61 year old male presents with intermittent chest pain for 2 days PMH significant for history of cardiac aneurysm, moderate to severe asthma, and GERD.  Patient reports is currently taking no GERD medication.  Past Medical History:  Diagnosis Date   Asthma    History of cardiac aneurysm    Pneumothorax    w/ COVID   Reflux     There are no problems to display for this patient.   Past Surgical History:  Procedure Laterality Date   BACK SURGERY     KNEE SURGERY     NASAL SINUS SURGERY         Home Medications    Prior to Admission medications   Medication Sig Start Date End Date Taking? Authorizing Provider  pantoprazole (PROTONIX) 40 MG tablet Take 1 tablet (40 mg total) by mouth daily. 06/05/22  Yes Trevor Iha, FNP  pravastatin (PRAVACHOL) 40 MG tablet Take 1 tablet by mouth daily. 02/12/22 02/12/23 Yes [provider]  tadalafil (CIALIS) 5 MG tablet Take 1 tablet by mouth daily. 10/02/21  Yes [provider]  albuterol (VENTOLIN HFA) 108 (90 Base) MCG/ACT inhaler Inhale into the lungs every 6 (six) hours as needed for wheezing or shortness of breath.    [provider]  cyclobenzaprine (FLEXERIL) 10 MG tablet Take 1 tablet (10 mg total) by mouth 2 (two) times daily as needed for muscle spasms. Patient not taking: Reported on 06/05/2022 01/14/22   Eustace Moore, MD  dupilumab (DUPIXENT) 200 MG/1. prefilled syringe INJECT 200MG  SUBCUTANEOUSLY EVERY OTHER WEEK 09/06/21   [provider]  fluticasone-salmeterol (ADVAIR DISKUS) 250-50 MCG/ACT AEPB Inhale into the lungs. 02/22/20   [provider]  tiotropium (SPIRIVA HANDIHALER) 18 MCG inhalation capsule Place into inhaler and inhale.    [provider]    Family History Family History  Problem Relation Age of Onset   Diabetes Father    Healthy Sister    Healthy Sister    Healthy Brother    Healthy Brother    Healthy Brother     Social History Social History   Tobacco Use   Smoking status: Never   Smokeless tobacco: Never  Vaping Use   Vaping Use: Never used  Substance Use Topics   Alcohol use: Yes    Alcohol/week: 6.0 standard drinks of alcohol    Types: 4 Cans of beer, 2 Shots of liquor per week   Drug use: No     Allergies   Patient has no known allergies.   Review of Systems Review of Systems  Cardiovascular:  Positive for chest pain.  All other systems reviewed and are negative.    Physical Exam Triage Vital Signs ED Triage Vitals [06/05/22 1114]  Enc Vitals Group     BP (!) 146/95     Pulse Rate 82     Resp 18     Temp 99.1 F (37.3 C)     Temp Source Oral     SpO2 98 %     Weight      Height      Head Circumference      Peak Flow      Pain Score  Pain Loc      Pain Edu?      Excl. in [place]?    No data found.  Updated Vital Signs BP (!) 146/95 (BP Location: Left Arm)   Pulse 82   Temp 99.1 F (37.3 C) (Oral)   Resp 18   Ht 6\' 2"  (1.88 m)   Wt 238 lb (108 kg)   SpO2 98%   BMI 30.56 kg/m    Physical Exam Vitals and nursing note reviewed.  Constitutional:      General: He is not in acute distress.    Appearance: He is well-developed. He is obese. He is not ill-appearing.  HENT:     Head: Normocephalic and atraumatic.  Eyes:     Extraocular Movements: Extraocular movements intact.     Pupils: Pupils are equal, round, and reactive to light.  Neck:     Vascular: No JVD.     Comments: No bruit Cardiovascular:     Rate and Rhythm: Normal rate and regular rhythm.     Pulses:          Radial pulses are 1+ on the right side and 1+ on the left side.       Posterior tibial pulses are 1+ on the right side and 1+ on the left side.     Heart sounds: Normal heart  sounds.     No friction rub. No gallop. No S3 or S4 sounds.  Pulmonary:     Breath sounds: Normal breath sounds.  Musculoskeletal:        General: Normal range of motion.     Cervical back: Normal range of motion and neck supple.  Skin:    General: Skin is warm and dry.  Neurological:     General: No focal deficit present.     Mental Status: He is alert and oriented to person, place, and time.      UC Treatments / Results  Labs (all labs ordered are listed, but only abnormal results are displayed) Labs Reviewed - No data to display  EKG   Radiology No results found.  Procedures Procedures (including critical care time)  Medications Ordered in UC Medications - No data to display  Initial Impression / Assessment and Plan / UC Course  I have reviewed the triage vital signs and the nursing notes.  Pertinent labs & imaging results that were available during my care of the patient were reviewed by me and considered in my medical decision making (see chart for details).     MDM: 1.  Atypical chest pain-EKG reveals normal sinus rhythm, T wave abnormality similar in appearance from EKG performed on 03/16/2014; 2.  GERD, unspecified whether esophagitis present-Rx'd Protonix. Advised patient to take medication as directed.  Advised patient to take medication on empty stomach with 8 ounces of water in the morning and no other medications including inhalers or food for 45 minutes in order that this medication (a proton pump inhibitor) can work effectively.  Advised patient if symptoms worsen and/or unresolved please follow-up with PCP, go to local ED or here for further evaluation.  Patient discharged home, hemodynamically stable. Final Clinical Impressions(s) / UC Diagnoses   Final diagnoses:  Atypical chest pain  Gastroesophageal reflux disease, unspecified whether esophagitis present     Discharge Instructions      Advised patient to take medication as directed.  Advised  patient to take medication on empty stomach with 8 ounces of water in the morning and no other medications including  inhalers or food for 45 minutes in order that this medication (a proton pump inhibitor) can work effectively.  Advised patient if symptoms worsen and/or unresolved please follow-up with PCP, go to local ED or here for further evaluation.     ED Prescriptions     Medication Sig Dispense Auth. Provider   pantoprazole (PROTONIX) 40 MG tablet Take 1 tablet (40 mg total) by mouth daily. 30 tablet Trevor Iha, FNP      PDMP not reviewed this encounter.   Trevor Iha, FNP 06/05/22 1208

## 2022-06-05 NOTE — ED Triage Notes (Signed)
Left sided chest pain since driving  home yesterday evening  Intermittent chest pain Feels sore  No reflux meds

## 2022-06-06 ENCOUNTER — Telehealth: Payer: Self-pay | Admitting: Emergency Medicine

## 2022-06-06 NOTE — Telephone Encounter (Signed)
Spoke with patient states that he is doing well, no issues today.  Will follow up with PCP.

## 2022-08-02 IMAGING — DX DG CHEST 2V
2 series · 2 of 2 positions shown · non-contrast
Comparison: April 04, 2020.

CLINICAL DATA: Productive cough.

EXAM:
CHEST - 2 VIEW

[chest pa]
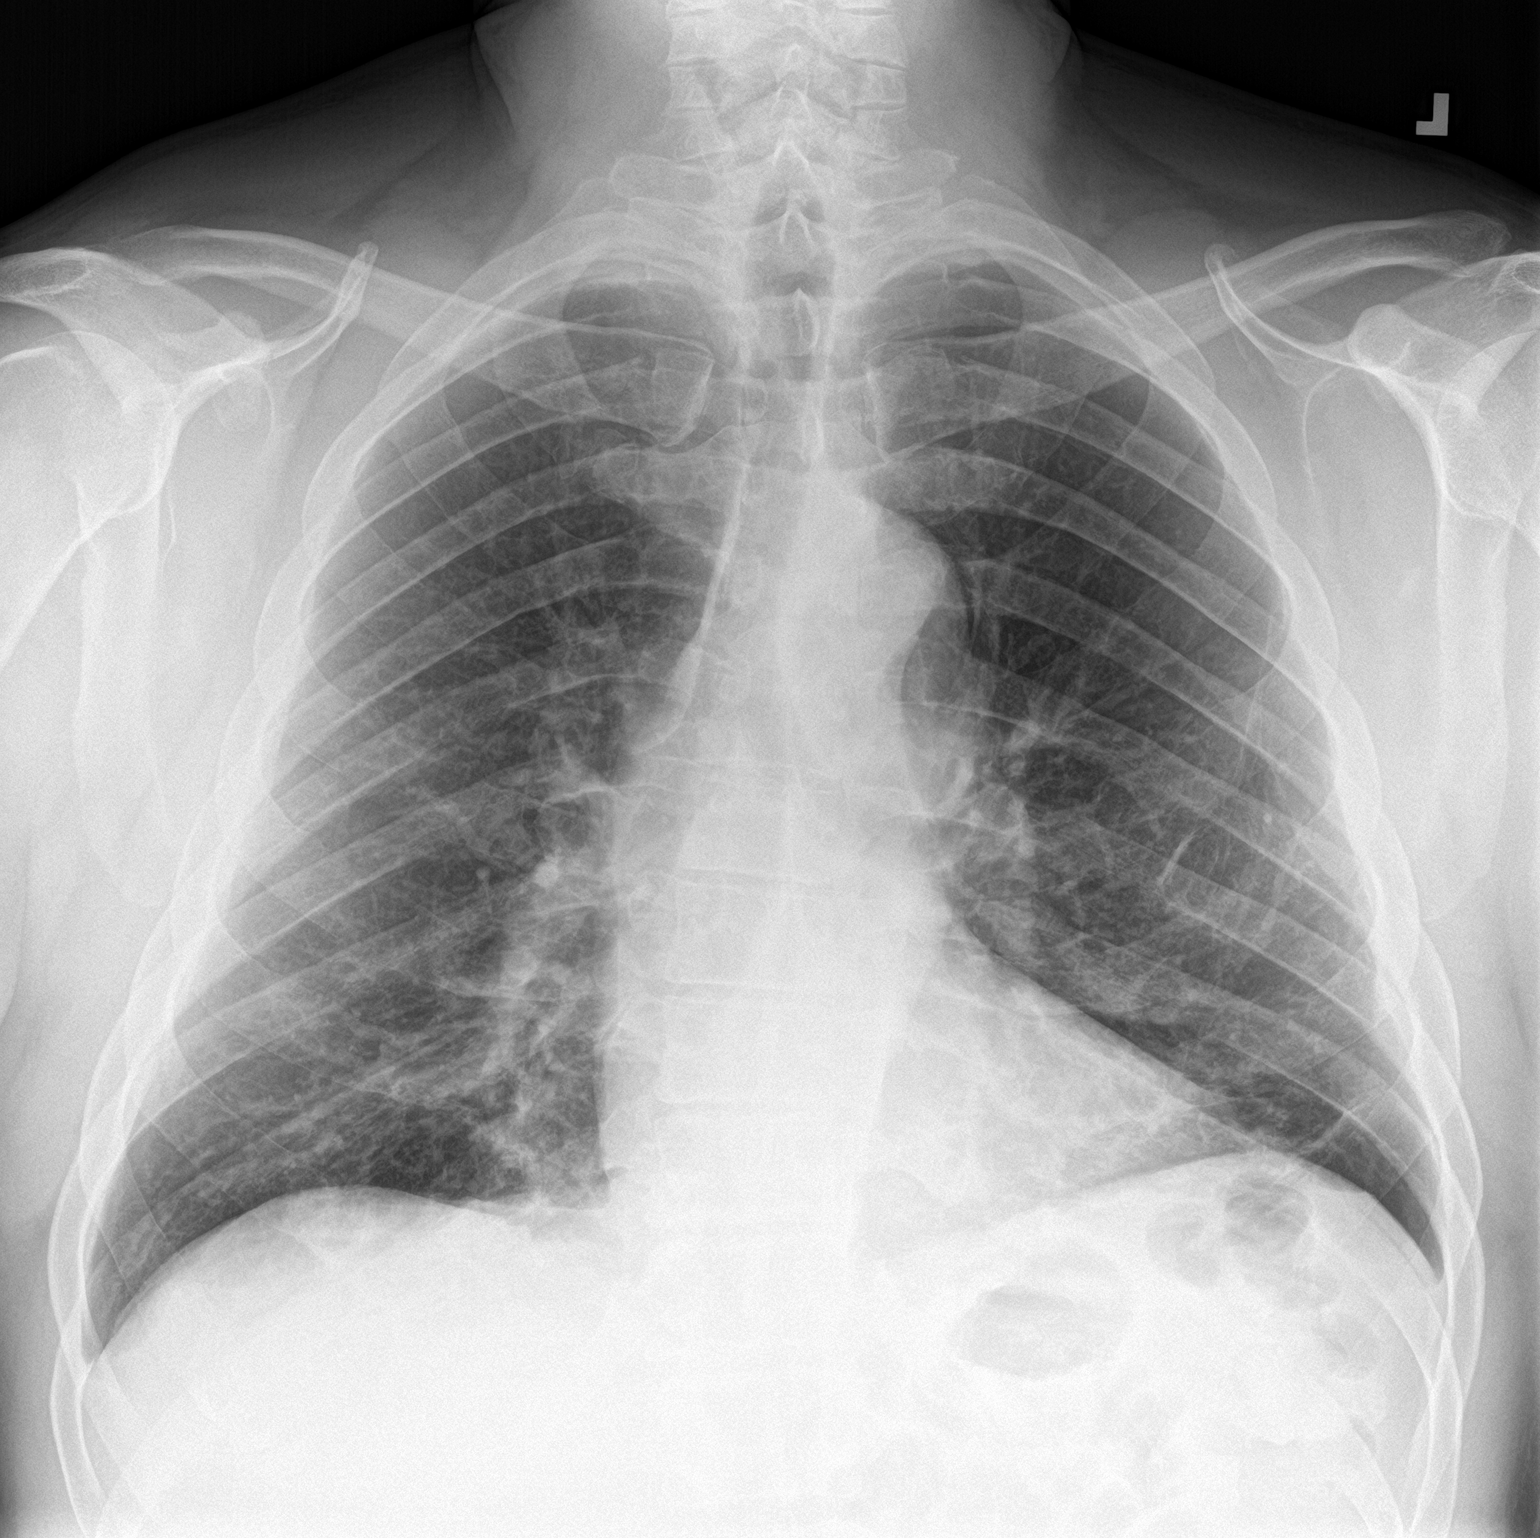

[chest lat]
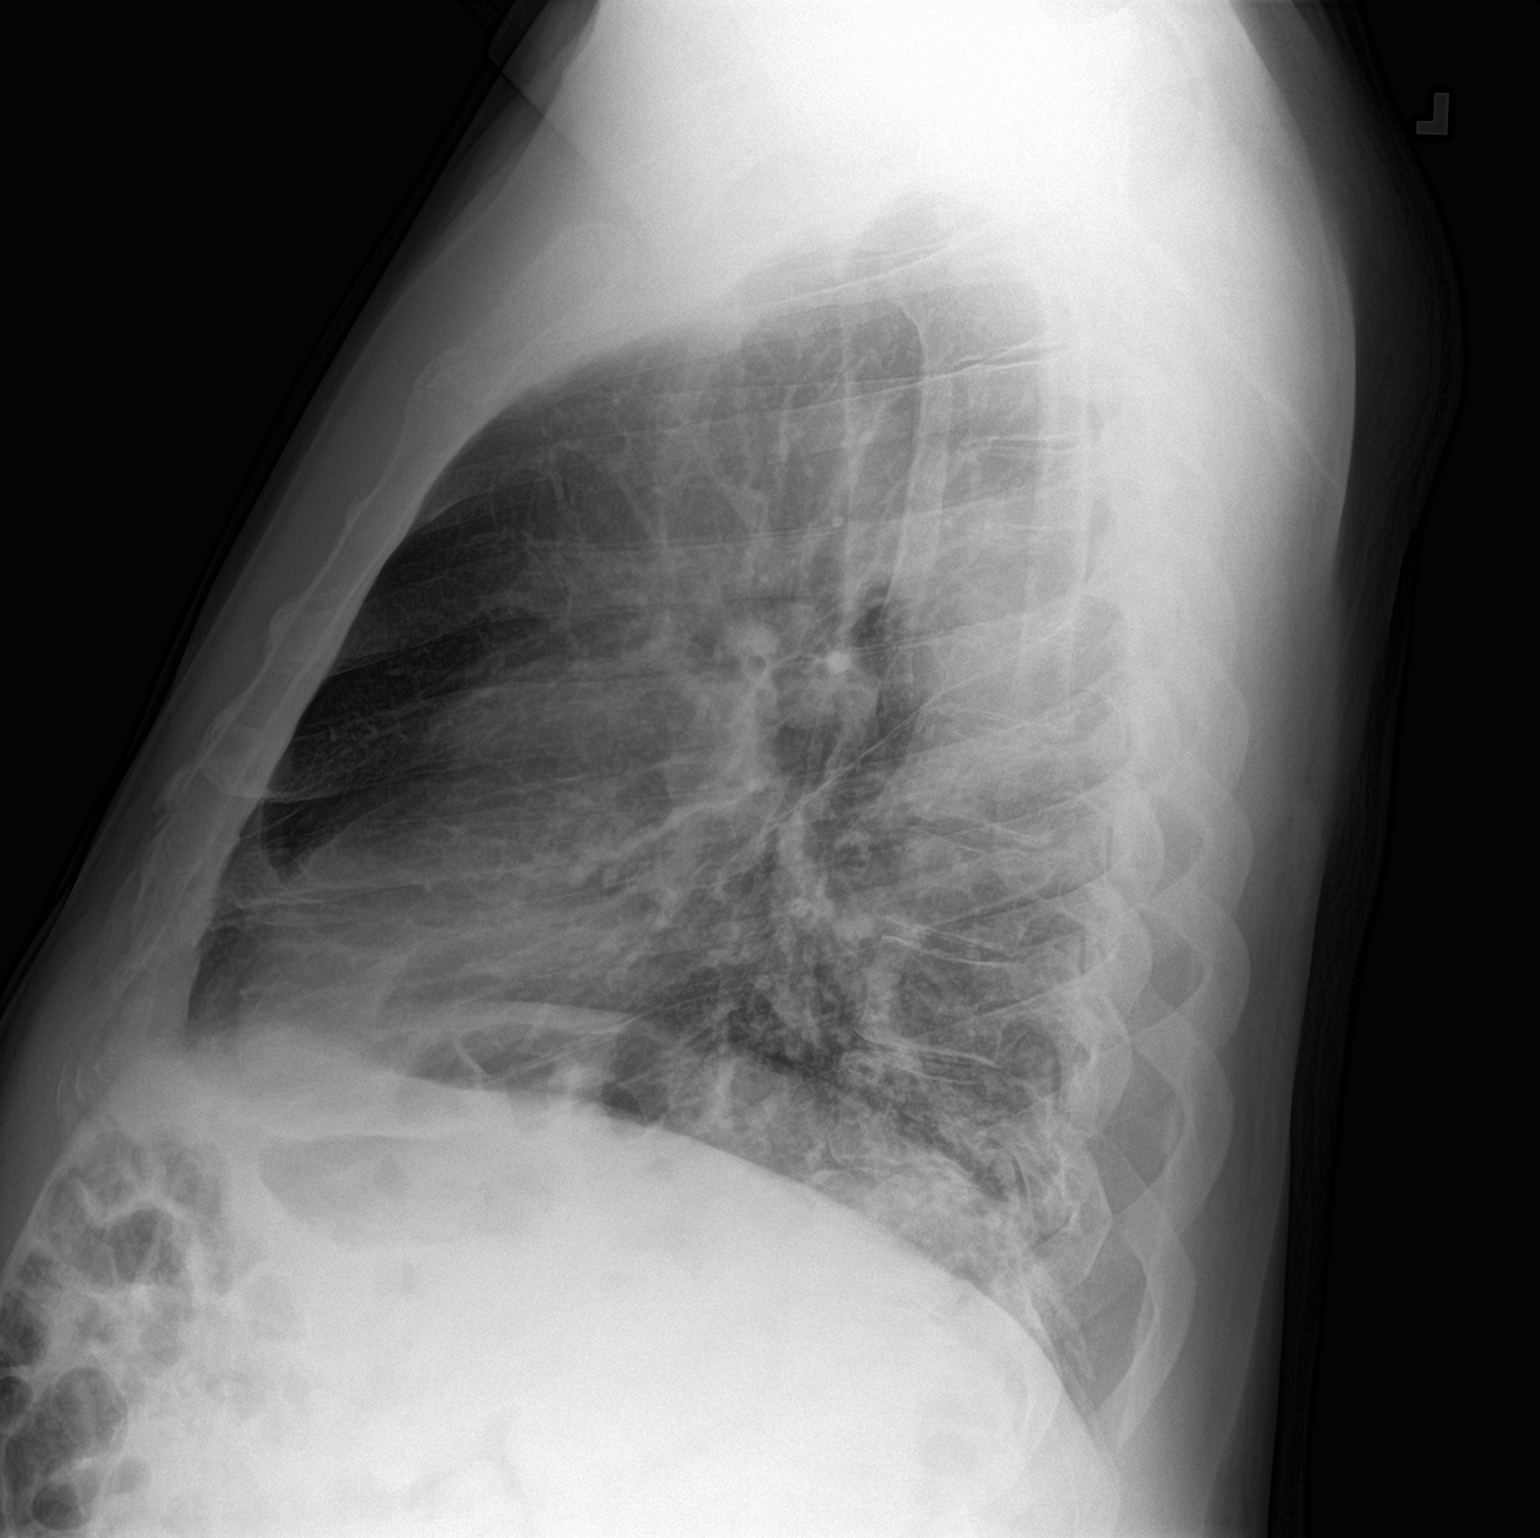

[2 of 2 positions shown; findings below may reference images not displayed]

FINDINGS: The heart size and mediastinal contours are within normal limits.
Stable bibasilar subsegmental atelectasis or scarring is noted. The
visualized skeletal structures are unremarkable.
IMPRESSION: Stable bibasilar subsegmental atelectasis or scarring.

## 2022-09-02 ENCOUNTER — Ambulatory Visit
Admission: EM | Admit: 2022-09-02 | Discharge: 2022-09-02 | Disposition: A | Discharge status: Home / Self Care | Payer: 59 | Attending: Family Medicine | Admitting: Family Medicine

## 2022-09-02 ENCOUNTER — Ambulatory Visit (INDEPENDENT_AMBULATORY_CARE_PROVIDER_SITE_OTHER): Payer: 59

## 2022-09-02 ENCOUNTER — Other Ambulatory Visit: Payer: Self-pay

## 2022-09-02 DIAGNOSIS — M545 Low back pain, unspecified: Secondary | ICD-10-CM

## 2022-09-02 DIAGNOSIS — I10 Essential (primary) hypertension: Secondary | ICD-10-CM

## 2022-09-02 DIAGNOSIS — M549 Dorsalgia, unspecified: Secondary | ICD-10-CM | POA: Diagnosis not present

## 2022-09-02 LAB — POCT URINALYSIS DIP (MANUAL ENTRY)
Bilirubin, UA: NEGATIVE
Glucose, UA: NEGATIVE mg/dL
Ketones, POC UA: NEGATIVE mg/dL
Leukocytes, UA: NEGATIVE
Nitrite, UA: NEGATIVE
Protein Ur, POC: NEGATIVE mg/dL
Spec Grav, UA: 1.025 (ref 1.010–1.025)
Urobilinogen, UA: 0.2 E.U./dL
pH, UA: 5.5 (ref 5.0–8.0)

## 2022-09-02 MED ORDER — LOSARTAN POTASSIUM 25 MG PO TABS: ORAL_TABLET | ORAL | 0 refills | Status: AC

## 2022-09-02 NOTE — ED Triage Notes (Signed)
Pt states that his blood pressure has been elevated and he has some lower back pain. X1 week

## 2022-09-02 NOTE — ED Provider Notes (Signed)
[facility]    CSN: [id number] Arrival date & time: 09/02/22  0913      History   Chief Complaint Chief Complaint  Patient presents with   Blood Pressure Check    High blood pressure and lower back pain. X1 week    HPI Jonathan Barajas is a 61 y.o. male.   Patient complains of increased blood pressure recently, generally about 160/110.  He is concerned that he may have a problem with his kidneys because he has had mild bilateral mid-back pain for about 3 weeks.  He denies dysuria and hematuria.  He denies chest pain, shortness of breath, edema, headache, etc.  He denies family history of hypertension and cardiovascular disease. Chart review reveals elevated BP during office visits in 2023.  CMP done 01/17/22 revealed normal renal function.  The history is provided by the patient.    Past Medical History:  Diagnosis Date   Asthma    History of cardiac aneurysm    Pneumothorax    w/ COVID   Reflux     There are no problems to display for this patient.   Past Surgical History:  Procedure Laterality Date   BACK SURGERY     KNEE SURGERY     NASAL SINUS SURGERY         Home Medications    Prior to Admission medications   Medication Sig Start Date End Date Taking? Authorizing Provider  albuterol (VENTOLIN HFA) 108 (90 Base) MCG/ACT inhaler Inhale into the lungs every 6 (six) hours as needed for wheezing or shortness of breath.   Yes [provider]  dupilumab (DUPIXENT) 200 MG/1. prefilled syringe INJECT 200MG  SUBCUTANEOUSLY EVERY OTHER WEEK 09/06/21  Yes [provider]  fluticasone-salmeterol (ADVAIR DISKUS) 250-50 MCG/ACT AEPB Inhale into the lungs. 02/22/20  Yes [provider]  losartan (COZAAR) 25 MG tablet Take one tab PO once daily for blood pressure 09/02/22  Yes Manolito Jurewicz, 13/5/23, MD  pantoprazole (PROTONIX) 40 MG tablet Take 1 tablet (40 mg total) by mouth daily. 06/05/22  Yes 08/05/22, FNP  pravastatin (PRAVACHOL) 40  MG tablet Take 1 tablet by mouth daily. 02/12/22 02/12/23 Yes [provider]  tadalafil (CIALIS) 5 MG tablet Take 1 tablet by mouth daily. 10/02/21  Yes [provider]  tiotropium (SPIRIVA HANDIHALER) 18 MCG inhalation capsule Place into inhaler and inhale.   Yes [provider]  cyclobenzaprine (FLEXERIL) 10 MG tablet Take 1 tablet (10 mg total) by mouth 2 (two) times daily as needed for muscle spasms. Patient not taking: Reported on 06/05/2022 01/14/22   01/16/22, MD    Family History Family History  Problem Relation Age of Onset   Diabetes Father    Healthy Sister    Healthy Sister    Healthy Brother    Healthy Brother    Healthy Brother     Social History Social History   Tobacco Use   Smoking status: Never   Smokeless tobacco: Never  Vaping Use   Vaping Use: Never used  Substance Use Topics   Alcohol use: Yes    Alcohol/week: 6.0 standard drinks of alcohol    Types: 4 Cans of beer, 2 Shots of liquor per week   Drug use: No     Allergies   Patient has no known allergies.   Review of Systems Review of Systems  Constitutional:  Negative for activity change, chills, diaphoresis, fatigue, fever and unexpected weight change.  HENT: Negative.  Eyes: Negative.   Respiratory: Negative.    Cardiovascular: Negative.   Gastrointestinal: Negative.   Endocrine: Negative.   Genitourinary: Negative.   Musculoskeletal:  Positive for back pain.  Neurological:  Negative for headaches.     Physical Exam Triage Vital Signs ED Triage Vitals  Enc Vitals Group     BP 09/02/22 0929 (!) 161/116     Pulse Rate 09/02/22 0929 76     Resp 09/02/22 0929 18     Temp 09/02/22 0929 98 F (36.7 C)     Temp Source 09/02/22 0929 Oral     SpO2 09/02/22 0929 96 %     Weight 09/02/22 0927 240 lb (108.9 kg)     Height 09/02/22 0927 6\' 2"  (1.88 m)     Head Circumference --      Peak Flow --      Pain Score 09/02/22 0927 6     Pain Loc --      Pain  Edu? --      Excl. in [place]? --    No data found.  Updated Vital Signs BP (!) 161/116 (BP Location: Left Arm)   Pulse 76   Temp 98 F (36.7 C) (Oral)   Resp 18   Ht 6\' 2"  (1.88 m)   Wt 108.9 kg   SpO2 96%   BMI 30.81 kg/m   Visual Acuity Right Eye Distance:   Left Eye Distance:   Bilateral Distance:    Right Eye Near:   Left Eye Near:    Bilateral Near:     Physical Exam Vitals and nursing note reviewed.  Constitutional:      General: He is not in acute distress. HENT:     Head: Normocephalic.     Mouth/Throat:     Mouth: Mucous membranes are moist.  Eyes:     Extraocular Movements: Extraocular movements intact.     Conjunctiva/sclera: Conjunctivae normal.     Pupils: Pupils are equal, round, and reactive to light.     Comments: Fundi benign.  Neck:     Vascular: No carotid bruit.  Cardiovascular:     Rate and Rhythm: Normal rate and regular rhythm.     Heart sounds: Normal heart sounds.  Pulmonary:     Breath sounds: Normal breath sounds.  Abdominal:     General: Bowel sounds are normal.     Palpations: Abdomen is soft. There is no mass.     Tenderness: There is no abdominal tenderness. There is no right CVA tenderness or left CVA tenderness.  Musculoskeletal:     Cervical back: Neck supple.     Lumbar back: Tenderness present. No swelling. Negative right straight leg raise test and negative left straight leg raise test.       Back:     Right lower leg: No edema.     Left lower leg: No edema.     Comments: Back:  Range of motion relatively well preserved.  Can heel/toe walk and squat without difficulty. Mild tenderness in the left paraspinous muscles from approximately L1 to L3.  Straight leg raising test is negative.  Sitting knee extension test is negative.  Strength and sensation in the lower extremities is normal.  Patellar and achilles reflexes are normal   Lymphadenopathy:     Cervical: No cervical adenopathy.  Skin:    General: Skin is warm and dry.   Neurological:     General: No focal deficit present.     Mental Status: He  is alert.     UC Treatments / Results  Labs (all labs ordered are listed, but only abnormal results are displayed) Labs Reviewed  POCT URINALYSIS DIP (MANUAL ENTRY) - Abnormal; Notable for the following components:      Result Value   Blood, UA trace-intact (*)    All other components within normal limits  COMPLETE METABOLIC PANEL WITH GFR    EKG   Radiology DG Lumbar Spine Complete  Result Date: 09/02/2022 CLINICAL DATA:  Three-week history of left mid back pain. EXAM: LUMBAR SPINE - COMPLETE 4+ VIEW COMPARISON:  None Available. FINDINGS: Mild convex leftward lumbar scoliosis evident. No evidence for fracture or subluxation. Marked loss of disc height noted L4-5 and L5-S1 with endplate spurring. SI joints are unremarkable. IMPRESSION: Degenerative disc disease at L4-5 and L5-S1. No acute bony abnormality. Electronically Signed   By: Kennith Center M.D.   On: 09/02/2022 11:05    Procedures Procedures (including critical care time)  Medications Ordered in UC Medications - No data to display  Initial Impression / Assessment and Plan / UC Course  I have reviewed the triage vital signs and the nursing notes.  Pertinent labs & imaging results that were available during my care of the patient were reviewed by me and considered in my medical decision making (see chart for details).    Note no proteinuria on today's UA. Begin losartan 25mg  one daily.  Check CMP.  Followup with PCP in about 2 weeks.   Final Clinical Impressions(s) / UC Diagnoses   Final diagnoses:  Mid back pain on left side  Essential hypertension     Discharge Instructions      Please monitor your blood pressure several times weekly, at different times of the day, record on a calendar with times of measurement and take this record to your Family Doctor.  For back pain, try applying ice pack for 20 to 30 minutes, 3 to 4 times  daily  Continue until pain decreases.  Try taking long acting acetaminophen for pain.  Followup with orthopedist if not improved 2 to 3 weeks.     ED Prescriptions     Medication Sig Dispense Auth. Provider   losartan (COZAAR) 25 MG tablet Take one tab PO once daily for blood pressure 30 tablet , MD         Lattie Haw, MD 09/03/22 [phone]

## 2022-09-02 NOTE — Discharge Instructions (Addendum)
Please monitor your blood pressure several times weekly, at different times of the day, record on a calendar with times of measurement and take this record to your Family Doctor.  For back pain, try applying ice pack for 20 to 30 minutes, 3 to 4 times daily  Continue until pain decreases.  Try taking long acting acetaminophen for pain.  Followup with orthopedist if not improved 2 to 3 weeks.

## 2022-09-03 LAB — COMPLETE METABOLIC PANEL WITH GFR
AG Ratio: 1.7 (calc) (ref 1.0–2.5)
ALT: 20 U/L (ref 9–46)
AST: 17 U/L (ref 10–35)
Albumin: 4.8 g/dL (ref 3.6–5.1)
Alkaline phosphatase (APISO): 66 U/L (ref 35–144)
BUN: 12 mg/dL (ref 7–25)
CO2: 21 mmol/L (ref 20–32)
Calcium: 9.4 mg/dL (ref 8.6–10.3)
Chloride: 106 mmol/L (ref 98–110)
Creat: 1.17 mg/dL (ref 0.70–1.35)
Globulin: 2.8 g/dL (calc) (ref 1.9–3.7)
Glucose, Bld: 88 mg/dL (ref 65–99)
Potassium: 4.5 mmol/L (ref 3.5–5.3)
Sodium: 139 mmol/L (ref 135–146)
Total Bilirubin: 0.7 mg/dL (ref 0.2–1.2)
Total Protein: 7.6 g/dL (ref 6.1–8.1)
eGFR: 71 mL/min/{1.73_m2} (ref >=60)

## 2023-03-18 ENCOUNTER — Ambulatory Visit
Admission: EM | Admit: 2023-03-18 | Discharge: 2023-03-18 | Disposition: A | Discharge status: Home / Self Care | Payer: 59 | Attending: Emergency Medicine | Admitting: Emergency Medicine

## 2023-03-18 DIAGNOSIS — R0982 Postnasal drip: Secondary | ICD-10-CM

## 2023-03-18 DIAGNOSIS — R058 Other specified cough: Secondary | ICD-10-CM | POA: Diagnosis not present

## 2023-03-18 DIAGNOSIS — J309 Allergic rhinitis, unspecified: Secondary | ICD-10-CM

## 2023-03-18 MED ORDER — IPRATROPIUM BROMIDE 0.06 % NA SOLN: 2.0000 | Freq: Three times a day (TID) | NASAL | 1 refills | Status: AC

## 2023-03-18 MED ORDER — TRIAMCINOLONE ACETONIDE 40 MG/ML IJ SUSP
40.0000 mg | Freq: Once | INTRAMUSCULAR | Status: AC
  Administered 2023-03-18: 40 mg via INTRAMUSCULAR

## 2023-03-18 MED ORDER — FEXOFENADINE HCL 180 MG PO TABS: 180.0000 mg | ORAL_TABLET | Freq: Every day | ORAL | 1 refills | Status: AC

## 2023-03-18 MED ORDER — PROMETHAZINE-DM 6.25-15 MG/5ML PO SYRP: 5.0000 mL | ORAL_SOLUTION | Freq: Every evening | ORAL | 0 refills | Status: AC | PRN

## 2023-03-18 MED ORDER — FLUTICASONE PROPIONATE 50 MCG/ACT NA SUSP: 1.0000 | Freq: Every day | NASAL | 2 refills | Status: AC

## 2023-03-18 NOTE — ED Triage Notes (Signed)
Pt c/o cough x 2 weeks. Says he had a cold at the time but cough has lingered. Hx of asthma. Taking dayquil/nyquil prn.

## 2023-03-18 NOTE — ED Provider Notes (Signed)
[facility]    CSN: [id number] Arrival date & time: 03/18/23  1218    HISTORY   Chief Complaint  Patient presents with   Cough   HPI Jonathan Barajas is a pleasant, 62 y.o. male who presents to urgent care today. Pt c/o nonproductive cough x 2 weeks. Says he had a cold when this pain can, the cold seems to have resolved but cough has lingered.  Patient reports hx of asthma.  States he has been taking dayquil/nyquil prn.  Denies fever, body aches, chills, headache, loss of taste or smell, nausea, vomiting, diarrhea, known sick contacts.  The history is provided by the patient.   Past Medical History:  Diagnosis Date   Asthma    History of cardiac aneurysm    Pneumothorax    w/ COVID   Reflux    There are no problems to display for this patient.  Past Surgical History:  Procedure Laterality Date   BACK SURGERY     KNEE SURGERY     NASAL SINUS SURGERY      Home Medications    Prior to Admission medications   Medication Sig Start Date End Date Taking? Authorizing Provider  albuterol (VENTOLIN HFA) 108 (90 Base) MCG/ACT inhaler Inhale into the lungs every 6 (six) hours as needed for wheezing or shortness of breath.    [provider]  dupilumab (DUPIXENT) 200 MG/1. prefilled syringe INJECT 200MG  SUBCUTANEOUSLY EVERY OTHER WEEK 09/06/21   [provider]  fluticasone-salmeterol (ADVAIR DISKUS) 250-50 MCG/ACT AEPB Inhale into the lungs. 02/22/20   [provider]  losartan (COZAAR) 25 MG tablet Take one tab PO once daily for blood pressure 09/02/22   Lattie Haw, MD  pantoprazole (PROTONIX) 40 MG tablet Take 1 tablet (40 mg total) by mouth daily. 06/05/22   Trevor Iha, FNP  pravastatin (PRAVACHOL) 40 MG tablet Take 1 tablet by mouth daily. 02/12/22 02/12/23  [provider]  tadalafil (CIALIS) 5 MG tablet Take 1 tablet by mouth daily. 10/02/21   [provider]  tiotropium (SPIRIVA HANDIHALER) 18 MCG inhalation  capsule Place into inhaler and inhale.    [provider]    Family History Family History  Problem Relation Age of Onset   Diabetes Father    Healthy Sister    Healthy Sister    Healthy Brother    Healthy Brother    Healthy Brother    Social History Social History   Tobacco Use   Smoking status: Never   Smokeless tobacco: Never  Vaping Use   Vaping Use: Never used  Substance Use Topics   Alcohol use: Yes    Alcohol/week: 6.0 standard drinks of alcohol    Types: 4 Cans of beer, 2 Shots of liquor per week   Drug use: No   Allergies   Patient has no known allergies.  Review of Systems Review of Systems Pertinent findings revealed after performing a 14 point review of systems has been noted in the history of present illness.  Physical Exam Vital Signs BP (!) 148/101 (BP Location: Right Arm)   Pulse 78   Temp 98.1 F (36.7 C) (Oral)   Resp 17   SpO2 97%   No data found.  Physical Exam Vitals and nursing note reviewed.  Constitutional:      General: He is not in acute distress.    Appearance: Normal appearance. He is not ill-appearing.  HENT:     Head: Normocephalic and atraumatic.  Salivary Glands: Right salivary gland is not diffusely enlarged or tender. Left salivary gland is not diffusely enlarged or tender.     Right Ear: Ear canal and external ear normal. No drainage. A middle ear effusion is present. There is no impacted cerumen. Tympanic membrane is bulging. Tympanic membrane is not injected or erythematous.     Left Ear: Ear canal and external ear normal. No drainage. A middle ear effusion is present. There is no impacted cerumen. Tympanic membrane is bulging. Tympanic membrane is not injected or erythematous.     Ears:     Comments: Bilateral EACs normal, both TMs bulging with clear fluid    Nose: Rhinorrhea present. No nasal deformity, septal deviation, signs of injury, nasal tenderness, mucosal edema or congestion. Rhinorrhea is clear.      Right Nostril: Occlusion present. No foreign body, epistaxis or septal hematoma.     Left Nostril: Occlusion present. No foreign body, epistaxis or septal hematoma.     Right Turbinates: Enlarged, swollen and pale.     Left Turbinates: Enlarged, swollen and pale.     Right Sinus: No maxillary sinus tenderness or frontal sinus tenderness.     Left Sinus: No maxillary sinus tenderness or frontal sinus tenderness.     Mouth/Throat:     Lips: Pink. No lesions.     Mouth: Mucous membranes are moist. No oral lesions.     Pharynx: Oropharynx is clear. Uvula midline. No posterior oropharyngeal erythema or uvula swelling.     Tonsils: No tonsillar exudate. 0 on the right. 0 on the left.     Comments: Postnasal drip Eyes:     General: Lids are normal.        Right eye: No discharge.        Left eye: No discharge.     Extraocular Movements: Extraocular movements intact.     Conjunctiva/sclera: Conjunctivae normal.     Right eye: Right conjunctiva is not injected.     Left eye: Left conjunctiva is not injected.  Neck:     Trachea: Trachea and phonation normal.  Cardiovascular:     Rate and Rhythm: Normal rate and regular rhythm.     Pulses: Normal pulses.     Heart sounds: Normal heart sounds. No murmur heard.    No friction rub. No gallop.  Pulmonary:     Effort: Pulmonary effort is normal. No accessory muscle usage, prolonged expiration or respiratory distress.     Breath sounds: Normal breath sounds. No stridor, decreased air movement or transmitted upper airway sounds. No decreased breath sounds, wheezing, rhonchi or rales.  Chest:     Chest wall: No tenderness.  Musculoskeletal:        General: Normal range of motion.     Cervical back: Normal range of motion and neck supple. Normal range of motion.  Lymphadenopathy:     Cervical: No cervical adenopathy.  Skin:    General: Skin is warm and dry.     Findings: No erythema or rash.  Neurological:     General: No focal deficit present.      Mental Status: He is alert and oriented to person, place, and time.  Psychiatric:        Mood and Affect: Mood normal.        Behavior: Behavior normal.     Visual Acuity Right Eye Distance:   Left Eye Distance:   Bilateral Distance:    Right Eye Near:   Left Eye Near:  Bilateral Near:     UC Couse / Diagnostics / Procedures:     Radiology No results found.  Procedures Procedures (including critical care time) EKG  Pending results:  Labs Reviewed - No data to display  Medications Ordered in UC: Medications  triamcinolone acetonide (KENALOG-40) injection 40 mg (40 mg Intramuscular Given 03/18/23 1245)    UC Diagnoses / Final Clinical Impressions(s)   I have reviewed the triage vital signs and the nursing notes.  Pertinent labs & imaging results that were available during my care of the patient were reviewed by me and considered in my medical decision making (see chart for details).    Final diagnoses:  Allergic rhinitis, unspecified seasonality, unspecified trigger  Post-nasal drip  Non-productive cough   Patient provided with prescriptions for second-generation histamine and nasal steroid spray, patient also advised to use antimuscarinic nasal spray in addition to steroid spray.  Patient provided with medicine DM for nighttime cough so he can get some sleep.  Conservative care recommended.  Return precautions advised.  Please see discharge instructions below for further details of plan of care as provided to patient. ED Prescriptions     Medication Sig Dispense Auth. Provider   fexofenadine (ALLEGRA) 180 MG tablet Take 1 tablet (180 mg total) by mouth daily. 90 tablet Theadora Rama Scales, PA-C   ipratropium (ATROVENT) 0.06 % nasal spray Place 2 sprays into both nostrils 3 (three) times daily. As needed for nasal congestion, runny nose 15 mL Theadora Rama Scales, PA-C   fluticasone (FLONASE) 50 MCG/ACT nasal spray Place 1 spray into both nostrils daily.  Begin by using 2 sprays in each nare daily for 3 to 5 days, then decrease to 1 spray in each nare daily. 15.8 mL Theadora Rama Scales, PA-C   promethazine-dextromethorphan (PROMETHAZINE-DM) 6.25-15 MG/5ML syrup Take 5 mLs by mouth at bedtime as needed for cough. 60 mL Theadora Rama Scales, PA-C      PDMP not reviewed this encounter.  Disposition Upon Discharge:  Condition: stable for discharge home Home: take medications as prescribed; routine discharge instructions as discussed; follow up as advised.  Patient presented with an acute illness with associated systemic symptoms and significant discomfort requiring urgent management. In my opinion, this is a condition that a prudent lay person (someone who possesses an average knowledge of health and medicine) may potentially expect to result in complications if not addressed urgently such as respiratory distress, impairment of bodily function or dysfunction of bodily organs.   Routine symptom specific, illness specific and/or disease specific instructions were discussed with the patient and/or caregiver at length.   As such, the patient has been evaluated and assessed, work-up was performed and treatment was provided in alignment with urgent care protocols and evidence based medicine.  Patient/parent/caregiver has been advised that the patient may require follow up for further testing and treatment if the symptoms continue in spite of treatment, as clinically indicated and appropriate.  Patient/parent/caregiver has been advised to return to the [facility] or PCP in 3-5 days if no better; to PCP or the Emergency Department if new signs and symptoms develop, or if the current signs or symptoms continue to change or worsen for further workup, evaluation and treatment as clinically indicated and appropriate  The patient will follow up with their current PCP if and as advised. If the patient does not currently have a PCP we will assist them in obtaining one.    The patient may need specialty follow up if the symptoms continue, in  spite of conservative treatment and management, for further workup, evaluation, consultation and treatment as clinically indicated and appropriate.  Patient/parent/caregiver verbalized understanding and agreement of plan as discussed.  All questions were addressed during visit.  Please see discharge instructions below for further details of plan.  Discharge Instructions:   Discharge Instructions      Your symptoms and my physical exam findings are concerning for exacerbation of your underlying allergies.     Please read below to learn more about the medications, dosages and frequencies that I recommend to help alleviate your symptoms and to get you feeling better soon:  Kenalog IM (triamcinolone):  To quickly address your significant respiratory inflammation, you were provided with an injection of Kenalog in the office today.  You should continue to feel the full benefit of the steroid for the next 24-36 hours.    Allegra (fexofenadine): This is an excellent second-generation antihistamine that helps to reduce respiratory inflammatory response to environmental allergens.  This medication is not known to cause daytime sleepiness so it can be taken in the daytime.  If you find that it does make you sleepy, please feel free to take it at bedtime.   Flonase (fluticasone): This is a steroid nasal spray that used once daily, 1 spray in each nare.  This works best when used on a daily basis. This medication does not work well if it is only used when you think you need it.  After 3 to 5 days of use, you will notice significant reduction of the inflammation and mucus production that is currently being caused by exposure to allergens, whether seasonal or environmental.  The most common side effect of this medication is nosebleeds.  If you experience a nosebleed, please discontinue use for 1 week, then feel free to resume.  If you find  that your insurance will not pay for this medication, please consider a different nasal steroids such as Nasonex (mometasone), or Nasacort (triamcinolone).   Atrovent (ipratropium): This is an excellent nasal decongestant spray that will not cause rebound congestion.  Please instill 2 sprays into each nare after using the nasal steroid and repeat 2 more times throughout the day.  I have added this nasal spray to the nasal steroid because nasal steroids can take several days to reach full effect.  Once you find that you are forgetting to use the spray more often than you remember to use it, you will know that you no longer need it.     Promethazine DM: Promethazine is both a nasal decongestant that dries up mucous membranes and an antinausea medication.  Promethazine often makes most patients feel fairly sleepy.  "DM" is dextromethorphan, a single symptom reliever which is a cough suppressant found in many over-the-counter cough medications and combination cold preparations.  Please take 5 mL before bedtime to minimize your cough which will help you sleep better.  I have sent a prescription for this medication to your pharmacy because it cannot be purchased over-the-counter.   If you find that your health insurance will not pay for allergy medications, please consider downloading the GoodRx app and using to get a better price than the "off the shelf" price.     If symptoms have not meaningfully improved in the next 5 to 7 days, please return for repeat evaluation or follow-up with your regular provider.  If symptoms have worsened in the next 3 to 5 days, please return for repeat evaluation or follow-up with your regular provider.  Thank you for visiting urgent care today.  We appreciate the opportunity to participate in your care.       This office note has been dictated using Teaching laboratory technician.  Unfortunately, this method of dictation can sometimes lead to typographical or  grammatical errors.  I apologize for your inconvenience in advance if this occurs.  Please do not hesitate to reach out to me if clarification is needed.      Theadora Rama Scales, [place] 03/20/23 [phone]

## 2023-03-18 NOTE — Discharge Instructions (Addendum)
Your symptoms and my physical exam findings are concerning for exacerbation of your underlying allergies.     Please read below to learn more about the medications, dosages and frequencies that I recommend to help alleviate your symptoms and to get you feeling better soon:  Kenalog IM (triamcinolone):  To quickly address your significant respiratory inflammation, you were provided with an injection of Kenalog in the office today.  You should continue to feel the full benefit of the steroid for the next 24-36 hours.    Allegra (fexofenadine): This is an excellent second-generation antihistamine that helps to reduce respiratory inflammatory response to environmental allergens.  This medication is not known to cause daytime sleepiness so it can be taken in the daytime.  If you find that it does make you sleepy, please feel free to take it at bedtime.   Flonase (fluticasone): This is a steroid nasal spray that used once daily, 1 spray in each nare.  This works best when used on a daily basis. This medication does not work well if it is only used when you think you need it.  After 3 to 5 days of use, you will notice significant reduction of the inflammation and mucus production that is currently being caused by exposure to allergens, whether seasonal or environmental.  The most common side effect of this medication is nosebleeds.  If you experience a nosebleed, please discontinue use for 1 week, then feel free to resume.  If you find that your insurance will not pay for this medication, please consider a different nasal steroids such as Nasonex (mometasone), or Nasacort (triamcinolone).   Atrovent (ipratropium): This is an excellent nasal decongestant spray that will not cause rebound congestion.  Please instill 2 sprays into each nare after using the nasal steroid and repeat 2 more times throughout the day.  I have added this nasal spray to the nasal steroid because nasal steroids can take several days to  reach full effect.  Once you find that you are forgetting to use the spray more often than you remember to use it, you will know that you no longer need it.     Promethazine DM: Promethazine is both a nasal decongestant that dries up mucous membranes and an antinausea medication.  Promethazine often makes most patients feel fairly sleepy.  "DM" is dextromethorphan, a single symptom reliever which is a cough suppressant found in many over-the-counter cough medications and combination cold preparations.  Please take 5 mL before bedtime to minimize your cough which will help you sleep better.  I have sent a prescription for this medication to your pharmacy because it cannot be purchased over-the-counter.   If you find that your health insurance will not pay for allergy medications, please consider downloading the GoodRx app and using to get a better price than the "off the shelf" price.     If symptoms have not meaningfully improved in the next 5 to 7 days, please return for repeat evaluation or follow-up with your regular provider.  If symptoms have worsened in the next 3 to 5 days, please return for repeat evaluation or follow-up with your regular provider.    Thank you for visiting urgent care today.  We appreciate the opportunity to participate in your care.

## 2023-04-15 ENCOUNTER — Ambulatory Visit
Admission: RE | Admit: 2023-04-15 | Discharge: 2023-04-15 | Disposition: A | Discharge status: Home / Self Care | Payer: 59 | Source: Ambulatory Visit | Attending: Urgent Care | Admitting: Urgent Care

## 2023-04-15 VITALS — BP 149/97 | HR 72 | Temp 98.1°F | Resp 18 | Ht 74.0 in | Wt 235.0 lb

## 2023-04-15 DIAGNOSIS — G4701 Insomnia due to medical condition: Secondary | ICD-10-CM

## 2023-04-15 DIAGNOSIS — M79605 Pain in left leg: Secondary | ICD-10-CM

## 2023-04-15 DIAGNOSIS — R252 Cramp and spasm: Secondary | ICD-10-CM

## 2023-04-15 DIAGNOSIS — G2581 Restless legs syndrome: Secondary | ICD-10-CM

## 2023-04-15 DIAGNOSIS — M79604 Pain in right leg: Secondary | ICD-10-CM | POA: Diagnosis not present

## 2023-04-15 MED ORDER — BACLOFEN 20 MG PO TABS: 20.0000 mg | ORAL_TABLET | Freq: Every day | ORAL | 0 refills | Status: AC

## 2023-04-15 NOTE — ED Triage Notes (Signed)
Patient c/o lower bilateral leg cramps x 1 week.  It's been happening on and off x 1 month, but has worsen over the past week, waken him at night.  Patient has taken Tylenol.

## 2023-04-15 NOTE — Discharge Instructions (Addendum)
We drew labs today to assess for the cause of your muscle cramping and spasms. We will contact you once we have the results. Please try to increase your water intake to drink at least 6 bottles of 16 ounce water daily. Please pick up a B-complex vitamin over the counter containing at least 30mg  Vit B6 and take three times daily.  I have prescribed you a muscle relaxer to take before bed to see if this will help.  If your symptoms persist, please discuss this with your primary care physician as they may need to change your cholesterol medication. Another option would be diltiazem, but this would need to be prescribed by your PCP if your symptoms do not respond to the above treatment.

## 2023-04-15 NOTE — ED Provider Notes (Signed)
[facility]    CSN: [id number] Arrival date & time: 04/15/23  1042      History   Chief Complaint Chief Complaint  Patient presents with   Leg Pain    Very bad cramps at night in legs. Loss of sleep due to this. - Entered by patient    HPI Jonathan Barajas is a 62 y.o. male.   62 year old male presents today due to concerns of nightly leg cramps. He states he has noticed this mildly over the past month, but over the past 1 week it has woken him up several times nightly.  He states he is asymptomatic during the day, and has a normal activity level.  Every night that he lays down, he reports significant intermittent cramping.  He reports it is better when laying on his back and worse when laying on his sides or if he moves his feet.  He denies a known history of restless leg syndrome, and does not feel like he is moving his legs excessively at night.  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  He denies PVD.  He tried to increase his water intake, and states now he is drinking two or three 16 ounce bottles daily, which did not help with the leg cramps.  He denies any new medications, although he does take pravastatin daily.  Patient does admit to drinking numerous beers throughout the day, but states he has not increased his quantity recently. Labs from 12/18/22 reviewed which are unremarkable apart from elevated lipid panel. Mg 2.1.   Leg Pain   Past Medical History:  Diagnosis Date   Asthma    History of cardiac aneurysm    Pneumothorax    w/ COVID   Reflux     There are no problems to display for this patient.   Past Surgical History:  Procedure Laterality Date   BACK SURGERY     KNEE SURGERY     NASAL SINUS SURGERY         Home Medications    Prior to Admission medications   Medication Sig Start Date End Date Taking? Authorizing Provider  albuterol  (VENTOLIN HFA) 108 (90 Base) MCG/ACT inhaler Inhale into the lungs every 6 (six) hours as needed for wheezing or shortness of breath.   Yes [provider]  baclofen (LIORESAL) 20 MG tablet Take 1 tablet (20 mg total) by mouth at bedtime. 04/15/23  Yes Bartt Gonzaga L, PA  dupilumab (DUPIXENT) 200 MG/1. prefilled syringe INJECT 200MG  SUBCUTANEOUSLY EVERY OTHER WEEK 09/06/21  Yes [provider]  fexofenadine (ALLEGRA) 180 MG tablet Take 1 tablet (180 mg total) by mouth daily. 03/18/23 09/14/23 Yes Theadora Rama Scales, PA-C  fluticasone (FLONASE) 50 MCG/ACT nasal spray Place 1 spray into both nostrils daily. Begin by using 2 sprays in each nare daily for 3 to 5 days, then decrease to 1 spray in each nare daily. 03/18/23  Yes Theadora Rama Scales, PA-C  fluticasone-salmeterol (ADVAIR DISKUS) 250-50 MCG/ACT AEPB Inhale into the lungs. 02/22/20  Yes [provider]  ipratropium (ATROVENT) 0.06 % nasal spray Place 2 sprays into both nostrils 3 (three) times daily. As needed for nasal congestion, runny nose 03/18/23  Yes Theadora Rama Scales, PA-C  promethazine-dextromethorphan (PROMETHAZINE-DM) 6.25-15 MG/5ML syrup Take 5 mLs by mouth at bedtime as needed for cough. 03/18/23  Yes Theadora Rama Scales, PA-C  tadalafil (CIALIS) 5 MG tablet Take 1 tablet by mouth daily. 10/02/21  Yes [provider]  tiotropium (SPIRIVA HANDIHALER) 18 MCG inhalation capsule Place into inhaler and inhale.   Yes [provider]  losartan (COZAAR) 25 MG tablet Take one tab PO once daily for blood pressure 09/02/22   Lattie Haw, MD  pantoprazole (PROTONIX) 40 MG tablet Take 1 tablet (40 mg total) by mouth daily. 06/05/22   Trevor Iha, FNP  pravastatin (PRAVACHOL) 40 MG tablet Take 1 tablet by mouth daily. 02/12/22 02/12/23  [provider]    Family History Family History  Problem Relation Age of Onset   Diabetes Father    Healthy Sister    Healthy Sister     Healthy Brother    Healthy Brother    Healthy Brother     Social History Social History   Tobacco Use   Smoking status: Never   Smokeless tobacco: Never  Vaping Use   Vaping Use: Never used  Substance Use Topics   Alcohol use: Yes    Alcohol/week: 6.0 standard drinks of alcohol    Types: 4 Cans of beer, 2 Shots of liquor per week   Drug use: No     Allergies   Patient has no known allergies.   Review of Systems Review of Systems As per HPI  Physical Exam Triage Vital Signs ED Triage Vitals  Enc Vitals Group     BP 04/15/23 1112 (!) 149/97     Pulse Rate 04/15/23 1112 72     Resp 04/15/23 1112 18     Temp 04/15/23 1112 98.1 F (36.7 C)     Temp Source 04/15/23 1112 Oral     SpO2 04/15/23 1112 96 %     Weight 04/15/23 1114 235 lb (106.6 kg)     Height 04/15/23 1114 6\' 2"  (1.88 m)     Head Circumference --      Peak Flow --      Pain Score 04/15/23 1113 2     Pain Loc --      Pain Edu? --      Excl. in GC? --    No data found.  Updated Vital Signs BP (!) 149/97 (BP Location: Right Arm)   Pulse 72   Temp 98.1 F (36.7 C) (Oral)   Resp 18   Ht 6\' 2"  (1.88 m)   Wt 235 lb (106.6 kg)   SpO2 96%   BMI 30.17 kg/m   Visual Acuity Right Eye Distance:   Left Eye Distance:   Bilateral Distance:    Right Eye Near:   Left Eye Near:    Bilateral Near:     Physical Exam Vitals and nursing note reviewed.  Constitutional:      General: He is not in acute distress.    Appearance: Normal appearance. He is normal weight. He is not ill-appearing, toxic-appearing or diaphoretic.  HENT:     Head: Normocephalic and atraumatic.  Cardiovascular:     Rate and Rhythm: Normal rate and regular rhythm.     Pulses: Normal pulses.  Pulmonary:     Effort: Pulmonary effort is normal. No respiratory distress.  Musculoskeletal:        General: No swelling, tenderness, deformity or signs of injury. Normal range of motion.     Right lower leg: No edema.     Left lower  leg: No edema.     Comments: Several surgical scars noted to L anterior knee  Lymphadenopathy:     Cervical: No cervical adenopathy.  Skin:    General: Skin is warm.     Capillary Refill: Capillary refill takes less than 2 seconds.     Coloration: Skin is not jaundiced or pale.     Findings: No bruising, erythema or rash.  Neurological:     General: No focal deficit present.     Mental Status: He is alert and oriented to person, place, and time.     Cranial Nerves: No cranial nerve deficit.     Sensory: No sensory deficit.     Motor: No weakness.     Gait: Gait normal.      UC Treatments / Results  Labs (all labs ordered are listed, but only abnormal results are displayed) Labs Reviewed  BASIC METABOLIC PANEL  MAGNESIUM  IRON AND TIBC  FERRITIN    EKG   Radiology No results found.  Procedures Procedures (including critical care time)  Medications Ordered in UC Medications - No data to display  Initial Impression / Assessment and Plan / UC Course  I have reviewed the triage vital signs and the nursing notes.  Pertinent labs & imaging results that were available during my care of the patient were reviewed by me and considered in my medical decision making (see chart for details).     B leg pain nocturnally - discussed sx with patient. Ddx: nocturnal cramps vs RLS vs thiamine deficiency vs PVD. Given pt's descriptions, suspect nocturnal night cramps. Encouraged increased water intake as pt is still not drinking enough. Will draw basic labs today, pt may need to follow up with PCP for advanced labs (B12, thiamine, etc) should all results be normal. Will do trial of nightly baclofen in addition to B complex OTC with 20mg  B6. If ineffective, pt may need to follow up with PCP to consider diltiazem or verapamil. Insomnia due to leg pain - pt not sleeping well at night due to leg pain. He is not describing rapid movement of legs however. Initial lab assessment performed  today. Will do trial of baclofen first Muscle cramps - as above. 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 We will contact you once we have the results. Please try to increase your water intake to drink at least 6 bottles of 16 ounce water daily. Please pick up a B-complex vitamin over the counter containing at least 30mg  Vit B6 and take three times daily.  I have prescribed you a muscle relaxer to take before bed to see if this will help.  If your symptoms persist, please discuss this with your primary care physician as they may need to change your cholesterol medication. Another option would be diltiazem, but this would need to be prescribed by your PCP if your symptoms do not respond to the above treatment.      ED Prescriptions     Medication Sig Dispense Auth. Provider   baclofen (LIORESAL) 20 MG tablet Take 1 tablet (20 mg total) by mouth at bedtime. 30 each Ramanda Paules L, PA      PDMP not reviewed this encounter.   Maretta Bees, [place] 04/15/23  1953  

## 2023-04-16 LAB — IRON AND TIBC
Iron Saturation: 47 % (ref 15–55)
Iron: 149 ug/dL (ref 38–169)
Total Iron Binding Capacity: 319 ug/dL (ref 250–450)
UIBC: 170 ug/dL (ref 111–343)

## 2023-04-16 LAB — BASIC METABOLIC PANEL
BUN/Creatinine Ratio: 12 (ref 10–24)
BUN: 13 mg/dL (ref 8–27)
CO2: 20 mmol/L (ref 20–29)
Calcium: 9.4 mg/dL (ref 8.6–10.2)
Chloride: 103 mmol/L (ref 96–106)
Creatinine, Ser: 1.13 mg/dL (ref 0.76–1.27)
Glucose: 92 mg/dL (ref 70–99)
Potassium: 4.4 mmol/L (ref 3.5–5.2)
Sodium: 139 mmol/L (ref 134–144)
eGFR: 74 mL/min/{1.73_m2} (ref >59)

## 2023-04-16 LAB — FERRITIN: Ferritin: 168 ng/mL (ref 30–400)

## 2023-04-16 LAB — MAGNESIUM: Magnesium: 2.1 mg/dL (ref 1.6–2.3)
